# Patient Record
Sex: Male | Born: 1940 | Race: White | Hispanic: No | Marital: Married | Smoking: Former smoker
Health system: Southern US, Community
[De-identification: ages and names within clinical notes are randomized; demographics above are authoritative.]

## PROBLEM LIST (undated history)

## (undated) DIAGNOSIS — I350 Nonrheumatic aortic (valve) stenosis: Secondary | ICD-10-CM

## (undated) DIAGNOSIS — M1990 Unspecified osteoarthritis, unspecified site: Secondary | ICD-10-CM

## (undated) DIAGNOSIS — L409 Psoriasis, unspecified: Secondary | ICD-10-CM

## (undated) DIAGNOSIS — Z9989 Dependence on other enabling machines and devices: Secondary | ICD-10-CM

## (undated) DIAGNOSIS — E119 Type 2 diabetes mellitus without complications: Secondary | ICD-10-CM

## (undated) DIAGNOSIS — G4733 Obstructive sleep apnea (adult) (pediatric): Secondary | ICD-10-CM

## (undated) DIAGNOSIS — L219 Seborrheic dermatitis, unspecified: Secondary | ICD-10-CM

## (undated) DIAGNOSIS — K5732 Diverticulitis of large intestine without perforation or abscess without bleeding: Secondary | ICD-10-CM

## (undated) DIAGNOSIS — I10 Essential (primary) hypertension: Secondary | ICD-10-CM

## (undated) DIAGNOSIS — J309 Allergic rhinitis, unspecified: Secondary | ICD-10-CM

## (undated) DIAGNOSIS — E785 Hyperlipidemia, unspecified: Secondary | ICD-10-CM

## (undated) DIAGNOSIS — Z8669 Personal history of other diseases of the nervous system and sense organs: Secondary | ICD-10-CM

## (undated) HISTORY — DX: Allergic rhinitis, unspecified: J30.9

## (undated) HISTORY — PX: TONSILLECTOMY: SUR1361

## (undated) HISTORY — DX: Personal history of other diseases of the nervous system and sense organs: Z86.69

## (undated) HISTORY — DX: Obstructive sleep apnea (adult) (pediatric): Z99.89

## (undated) HISTORY — PX: KNEE ARTHROSCOPY: SUR90

## (undated) HISTORY — PX: HAND SURGERY: SHX662

## (undated) HISTORY — DX: Nonrheumatic aortic (valve) stenosis: I35.0

## (undated) HISTORY — DX: Seborrheic dermatitis, unspecified: L21.9

## (undated) HISTORY — DX: Diverticulitis of large intestine without perforation or abscess without bleeding: K57.32

## (undated) HISTORY — PX: HERNIA REPAIR: SHX51

## (undated) HISTORY — PX: VASECTOMY: SHX75

## (undated) HISTORY — DX: Obstructive sleep apnea (adult) (pediatric): G47.33

---

## 2000-01-21 ENCOUNTER — Encounter: Admission: RE | Admit: 2000-01-21 | Discharge: 2000-01-21 | Payer: Self-pay | Admitting: *Deleted

## 2000-01-21 ENCOUNTER — Encounter: Payer: Self-pay | Admitting: *Deleted

## 2005-09-10 ENCOUNTER — Ambulatory Visit (HOSPITAL_COMMUNITY): Admission: RE | Admit: 2005-09-10 | Discharge: 2005-09-10 | Payer: Self-pay | Admitting: Gastroenterology

## 2007-02-22 ENCOUNTER — Encounter: Admission: RE | Admit: 2007-02-22 | Discharge: 2007-03-01 | Payer: Self-pay | Admitting: Specialist

## 2011-11-02 DIAGNOSIS — M660 Rupture of popliteal cyst: Secondary | ICD-10-CM

## 2011-11-02 HISTORY — DX: Rupture of popliteal cyst: M66.0

## 2012-07-12 ENCOUNTER — Other Ambulatory Visit: Payer: Self-pay | Admitting: Internal Medicine

## 2012-07-14 ENCOUNTER — Ambulatory Visit
Admission: RE | Admit: 2012-07-14 | Discharge: 2012-07-14 | Disposition: A | Discharge status: Home / Self Care | Payer: Medicare Other | Source: Ambulatory Visit | Attending: Internal Medicine | Admitting: Internal Medicine

## 2012-07-14 ENCOUNTER — Other Ambulatory Visit: Payer: Self-pay | Admitting: Internal Medicine

## 2012-07-14 MED ORDER — GADOBENATE DIMEGLUMINE 529 MG/ML IV SOLN
17.0000 mL | Freq: Once | INTRAVENOUS | Status: AC | PRN
  Administered 2012-07-14: 17 mL via INTRAVENOUS

## 2012-07-25 ENCOUNTER — Other Ambulatory Visit: Payer: Self-pay | Admitting: Orthopedic Surgery

## 2012-07-25 DIAGNOSIS — M7120 Synovial cyst of popliteal space [Baker], unspecified knee: Secondary | ICD-10-CM

## 2012-07-29 ENCOUNTER — Ambulatory Visit
Admission: RE | Admit: 2012-07-29 | Discharge: 2012-07-29 | Disposition: A | Discharge status: Home / Self Care | Payer: Medicare Other | Source: Ambulatory Visit | Attending: Orthopedic Surgery | Admitting: Orthopedic Surgery

## 2012-07-29 DIAGNOSIS — M7120 Synovial cyst of popliteal space [Baker], unspecified knee: Secondary | ICD-10-CM

## 2012-08-25 ENCOUNTER — Encounter (HOSPITAL_BASED_OUTPATIENT_CLINIC_OR_DEPARTMENT_OTHER): Payer: Self-pay | Admitting: *Deleted

## 2012-08-28 ENCOUNTER — Encounter (HOSPITAL_BASED_OUTPATIENT_CLINIC_OR_DEPARTMENT_OTHER): Payer: Self-pay | Admitting: *Deleted

## 2012-08-28 NOTE — Progress Notes (Signed)
NPO AFTER MN. ARRIVES AT 1030. NEEDS ISTAT AND EKG. 

## 2012-08-29 ENCOUNTER — Encounter (HOSPITAL_BASED_OUTPATIENT_CLINIC_OR_DEPARTMENT_OTHER): Payer: Self-pay

## 2012-08-29 ENCOUNTER — Ambulatory Visit (HOSPITAL_BASED_OUTPATIENT_CLINIC_OR_DEPARTMENT_OTHER): Payer: Medicare Other

## 2012-08-29 ENCOUNTER — Encounter (HOSPITAL_BASED_OUTPATIENT_CLINIC_OR_DEPARTMENT_OTHER)
Admission: RE | Disposition: A | Discharge status: Home / Self Care | Payer: Self-pay | Source: Ambulatory Visit | Attending: Orthopedic Surgery

## 2012-08-29 ENCOUNTER — Ambulatory Visit (HOSPITAL_BASED_OUTPATIENT_CLINIC_OR_DEPARTMENT_OTHER)
Admission: RE | Admit: 2012-08-29 | Discharge: 2012-08-29 | Disposition: A | Discharge status: Home / Self Care | Payer: Medicare Other | Source: Ambulatory Visit | Attending: Orthopedic Surgery | Admitting: Orthopedic Surgery

## 2012-08-29 ENCOUNTER — Encounter (HOSPITAL_BASED_OUTPATIENT_CLINIC_OR_DEPARTMENT_OTHER): Payer: Self-pay | Admitting: Anesthesiology

## 2012-08-29 ENCOUNTER — Encounter (HOSPITAL_BASED_OUTPATIENT_CLINIC_OR_DEPARTMENT_OTHER): Payer: Self-pay | Admitting: *Deleted

## 2012-08-29 DIAGNOSIS — E785 Hyperlipidemia, unspecified: Secondary | ICD-10-CM | POA: Insufficient documentation

## 2012-08-29 DIAGNOSIS — IMO0002 Reserved for concepts with insufficient information to code with codable children: Secondary | ICD-10-CM | POA: Insufficient documentation

## 2012-08-29 DIAGNOSIS — M1710 Unilateral primary osteoarthritis, unspecified knee: Secondary | ICD-10-CM | POA: Insufficient documentation

## 2012-08-29 DIAGNOSIS — Z79899 Other long term (current) drug therapy: Secondary | ICD-10-CM | POA: Insufficient documentation

## 2012-08-29 DIAGNOSIS — X58XXXA Exposure to other specified factors, initial encounter: Secondary | ICD-10-CM | POA: Insufficient documentation

## 2012-08-29 DIAGNOSIS — M1711 Unilateral primary osteoarthritis, right knee: Secondary | ICD-10-CM | POA: Diagnosis present

## 2012-08-29 DIAGNOSIS — M23205 Derangement of unspecified medial meniscus due to old tear or injury, unspecified knee: Secondary | ICD-10-CM | POA: Diagnosis present

## 2012-08-29 DIAGNOSIS — E119 Type 2 diabetes mellitus without complications: Secondary | ICD-10-CM | POA: Insufficient documentation

## 2012-08-29 DIAGNOSIS — I10 Essential (primary) hypertension: Secondary | ICD-10-CM | POA: Insufficient documentation

## 2012-08-29 HISTORY — DX: Type 2 diabetes mellitus without complications: E11.9

## 2012-08-29 HISTORY — DX: Unspecified osteoarthritis, unspecified site: M19.90

## 2012-08-29 HISTORY — DX: Essential (primary) hypertension: I10

## 2012-08-29 HISTORY — DX: Hyperlipidemia, unspecified: E78.5

## 2012-08-29 LAB — GLUCOSE, CAPILLARY: Glucose-Capillary: 257 mg/dL — ABNORMAL HIGH (ref 70–99)

## 2012-08-29 LAB — POCT I-STAT 4, (NA,K, GLUC, HGB,HCT)
HCT: 45 % (ref 39.0–52.0)
Sodium: 140 mEq/L (ref 135–145)

## 2012-08-29 SURGERY — ARTHROSCOPY, KNEE, WITH MEDIAL MENISCECTOMY
Anesthesia: General | Site: Knee | Laterality: Right | Wound class: Clean

## 2012-08-29 MED ORDER — PROPOFOL 10 MG/ML IV BOLUS
INTRAVENOUS | Status: DC | PRN
  Administered 2012-08-29: 100 mg via INTRAVENOUS
  Administered 2012-08-29: 200 mg via INTRAVENOUS
  Administered 2012-08-29: 100 mg via INTRAVENOUS

## 2012-08-29 MED ORDER — LIDOCAINE HCL (CARDIAC) 20 MG/ML IV SOLN
INTRAVENOUS | Status: DC | PRN
  Administered 2012-08-29: 100 mg via INTRAVENOUS

## 2012-08-29 MED ORDER — LACTATED RINGERS IV SOLN
INTRAVENOUS | Status: DC | PRN
  Administered 2012-08-29: 11:00:00 via INTRAVENOUS

## 2012-08-29 MED ORDER — LACTATED RINGERS IV SOLN
INTRAVENOUS | Status: DC
  Administered 2012-08-29: 11:00:00 via INTRAVENOUS

## 2012-08-29 MED ORDER — ONDANSETRON HCL 4 MG/2ML IJ SOLN
INTRAMUSCULAR | Status: DC | PRN
  Administered 2012-08-29: 4 mg via INTRAVENOUS

## 2012-08-29 MED ORDER — CEFAZOLIN SODIUM-DEXTROSE 2-3 GM-% IV SOLR: 2.0000 g | INTRAVENOUS | Status: DC

## 2012-08-29 MED ORDER — BUPIVACAINE-EPINEPHRINE 0.25% -1:200000 IJ SOLN
INTRAMUSCULAR | Status: DC | PRN
  Administered 2012-08-29: 30 mL

## 2012-08-29 MED ORDER — LACTATED RINGERS IV SOLN: INTRAVENOUS | Status: DC

## 2012-08-29 MED ORDER — SODIUM CHLORIDE 0.9 % IR SOLN
Status: DC | PRN
  Administered 2012-08-29: 6000 mL

## 2012-08-29 MED ORDER — MEPERIDINE HCL 25 MG/ML IJ SOLN: 6.2500 mg | INTRAMUSCULAR | Status: DC | PRN

## 2012-08-29 MED ORDER — PROMETHAZINE HCL 25 MG/ML IJ SOLN: 6.2500 mg | INTRAMUSCULAR | Status: DC | PRN

## 2012-08-29 MED ORDER — DEXTROSE-NACL 5-0.45 % IV SOLN
INTRAVENOUS | Status: DC | PRN
  Administered 2012-08-29: 13:00:00 via INTRAVENOUS

## 2012-08-29 MED ORDER — OXYCODONE-ACETAMINOPHEN 10-650 MG PO TABS: 1.0000 | ORAL_TABLET | Freq: Four times a day (QID) | ORAL | Status: DC | PRN

## 2012-08-29 MED ORDER — FENTANYL CITRATE 0.05 MG/ML IJ SOLN
INTRAMUSCULAR | Status: DC | PRN
  Administered 2012-08-29 (×3): 50 ug via INTRAVENOUS
  Administered 2012-08-29: 100 ug via INTRAVENOUS
  Administered 2012-08-29: 50 ug via INTRAVENOUS

## 2012-08-29 MED ORDER — FENTANYL CITRATE 0.05 MG/ML IJ SOLN: 25.0000 ug | INTRAMUSCULAR | Status: DC | PRN

## 2012-08-29 MED ORDER — ALBUTEROL SULFATE (5 MG/ML) 0.5% IN NEBU
2.5000 mg | INHALATION_SOLUTION | Freq: Four times a day (QID) | RESPIRATORY_TRACT | Status: DC | PRN
  Administered 2012-08-29: 2.5 mg via RESPIRATORY_TRACT

## 2012-08-29 MED ORDER — MIDAZOLAM HCL 5 MG/5ML IJ SOLN
INTRAMUSCULAR | Status: DC | PRN
  Administered 2012-08-29: 2 mg via INTRAVENOUS

## 2012-08-29 MED ORDER — KETOROLAC TROMETHAMINE 30 MG/ML IJ SOLN
INTRAMUSCULAR | Status: DC | PRN
Start: 2012-08-29 — End: 2012-08-29
  Administered 2012-08-29: 30 mg via INTRAVENOUS

## 2012-08-29 MED ORDER — BACITRACIN-NEOMYCIN-POLYMYXIN 400-5-5000 EX OINT
TOPICAL_OINTMENT | CUTANEOUS | Status: DC | PRN
  Administered 2012-08-29: 1 via TOPICAL

## 2012-08-29 MED ORDER — CHLORHEXIDINE GLUCONATE 4 % EX LIQD: 60.0000 mL | Freq: Once | CUTANEOUS | Status: DC

## 2012-08-29 SURGICAL SUPPLY — 47 items
BANDAGE ELASTIC 4 VELCRO ST LF (GAUZE/BANDAGES/DRESSINGS) ×2 IMPLANT
BANDAGE ELASTIC 6 VELCRO ST LF (GAUZE/BANDAGES/DRESSINGS) ×2 IMPLANT
BLADE CUDA 5.5 (BLADE) IMPLANT
BLADE CUTTER GATOR 3.5 (BLADE) IMPLANT
BLADE CUTTER MENIS 5.5 (BLADE) IMPLANT
BLADE GREAT WHITE 4.2 (BLADE) ×2 IMPLANT
BLADE SURG 15 STRL LF DISP TIS (BLADE) IMPLANT
BLADE SURG 15 STRL SS (BLADE)
BNDG COHESIVE 6X5 TAN NS LF (GAUZE/BANDAGES/DRESSINGS) ×2 IMPLANT
CANISTER SUCT LVC 12 LTR MEDI- (MISCELLANEOUS) ×2 IMPLANT
CANISTER SUCTION 2500CC (MISCELLANEOUS) ×2 IMPLANT
CLOTH BEACON ORANGE TIMEOUT ST (SAFETY) ×2 IMPLANT
DRAPE ARTHROSCOPY W/POUCH 114 (DRAPES) ×2 IMPLANT
DRAPE LG THREE QUARTER DISP (DRAPES) ×2 IMPLANT
DRSG EMULSION OIL 3X3 NADH (GAUZE/BANDAGES/DRESSINGS) ×2 IMPLANT
DRSG PAD ABDOMINAL 8X10 ST (GAUZE/BANDAGES/DRESSINGS) ×4 IMPLANT
DURAPREP 26ML APPLICATOR (WOUND CARE) ×2 IMPLANT
ELECT MENISCUS 165MM 90D (ELECTRODE) IMPLANT
ELECT REM PT RETURN 9FT ADLT (ELECTROSURGICAL)
ELECTRODE REM PT RTRN 9FT ADLT (ELECTROSURGICAL) IMPLANT
GELFOAM SMALL 12 7MM 315 3 (MISCELLANEOUS) IMPLANT
GLOVE BIO SURGEON STRL SZ 6.5 (GLOVE) ×1 IMPLANT
GLOVE ECLIPSE 6.0 STRL STRAW (GLOVE) ×1 IMPLANT
GLOVE ECLIPSE 8.0 STRL XLNG CF (GLOVE) ×2 IMPLANT
GLOVE INDICATOR 8.5 STRL (GLOVE) ×4 IMPLANT
GLOVE SURG ORTHO 6.0 STRL STRW (GLOVE) ×2 IMPLANT
GOWN PREVENTION PLUS XLARGE (GOWN DISPOSABLE) ×1 IMPLANT
GOWN STRL NON-REIN LRG LVL3 (GOWN DISPOSABLE) ×1 IMPLANT
GOWN W/COTTON TOWEL STD LRG (GOWNS) ×1 IMPLANT
GOWN XL W/COTTON TOWEL STD (GOWNS) ×1 IMPLANT
IV NS IRRIG 3000ML ARTHROMATIC (IV SOLUTION) ×3 IMPLANT
KNEE WRAP E Z 3 GEL PACK (MISCELLANEOUS) ×2 IMPLANT
MINI VAC (SURGICAL WAND) ×2 IMPLANT
PACK ARTHROSCOPY DSU (CUSTOM PROCEDURE TRAY) ×2 IMPLANT
PACK BASIN DAY SURGERY FS (CUSTOM PROCEDURE TRAY) ×2 IMPLANT
PAD CAST 4YDX4 CTTN HI CHSV (CAST SUPPLIES) IMPLANT
PADDING CAST COTTON 4X4 STRL (CAST SUPPLIES) ×2
PADDING CAST COTTON 6X4 STRL (CAST SUPPLIES) ×2 IMPLANT
PENCIL BUTTON HOLSTER BLD 10FT (ELECTRODE) IMPLANT
SET ARTHROSCOPY TUBING (MISCELLANEOUS) ×2
SET ARTHROSCOPY TUBING LN (MISCELLANEOUS) ×1 IMPLANT
SPONGE GAUZE 4X4 12PLY (GAUZE/BANDAGES/DRESSINGS) ×2 IMPLANT
SUT ETHILON 3 0 PS 1 (SUTURE) ×2 IMPLANT
SYR CONTROL 10ML LL (SYRINGE) ×1 IMPLANT
TOWEL OR 17X24 6PK STRL BLUE (TOWEL DISPOSABLE) ×2 IMPLANT
WAND 90 DEG TURBOVAC W/CORD (SURGICAL WAND) ×2 IMPLANT
WATER STERILE IRR 500ML POUR (IV SOLUTION) ×2 IMPLANT

## 2012-08-29 NOTE — Brief Op Note (Signed)
08/29/2012  1:17 PM  PATIENT:  [name]  71 y.o. male  PRE-OPERATIVE DIAGNOSIS:  Right Medial mensical tear and Osteoarthritis  POST-OPERATIVE DIAGNOSIS: Same as Pre-Op  PROCEDURE:  Procedure(s) (LRB) with comments: KNEE ARTHROSCOPY WITH MEDIAL MENISECTOMY (Right) - abrasion chondopalsty, micro fracture technique chondoplasty, synovectomy supra patellar Pouch  SURGEON:  Surgeon(s) and Role:    * Jacki Cones, MD - Primary     ASSISTANTS: OR Tech   ANESTHESIA:   general  EBL:     BLOOD ADMINISTERED:none  DRAINS: none   LOCAL MEDICATIONS USED:  BUPIVICAINE 0.25%,30cc  SPECIMEN:  No Specimen  DISPOSITION OF SPECIMEN:  N/A  COUNTS:  YES  TOURNIQUET:  * No tourniquets in log *  DICTATION: .Other Dictation: Dictation Number [id number]  PLAN OF CARE: Discharge to home after PACU  PATIENT DISPOSITION:  PACU - hemodynamically stable.   Delay start of Pharmacological VTE agent (>24hrs) due to surgical blood loss or risk of bleeding: yes

## 2012-08-29 NOTE — Interval H&P Note (Signed)
History and Physical Interval Note:  08/29/2012 12:32 PM  [name]  has presented today for surgery, with the diagnosis of right mensical tear   The various methods of treatment have been discussed with the patient and family. After consideration of risks, benefits and other options for treatment, the patient has consented to  Procedure(s) (LRB) with comments: KNEE ARTHROSCOPY WITH MEDIAL MENISECTOMY (Right) as a surgical intervention .  The patient's history has been reviewed, patient examined, no change in status, stable for surgery.  I have reviewed the patient's chart and labs.  Questions were answered to the patient's satisfaction.     [name] A

## 2012-08-29 NOTE — H&P (Signed)
[name] is an 71 y.o. male.   Chief Complaint: Pain in Right Knee HPI: Torn Medial Meniscus Right Knee  Past Medical History  Diagnosis Date  . Acute meniscal tear of knee RIGHT KNEE  . Hypertension   . Hyperlipidemia   . Diabetes mellitus, type 2   . Arthritis THUMB  . Nichol's cyst of knee RIGHT    Past Surgical History  Procedure Date  . Knee arthroscopy 1988 (APPROX)    RIGHT KNEE    History reviewed. No pertinent family history. Social History:  reports that he has never smoked. He has never used smokeless tobacco. He reports that he does not drink alcohol or use illicit drugs.  Allergies: No Known Allergies  Medications Prior to Admission  Medication Sig Dispense Refill  . acetaminophen (TYLENOL) 325 MG tablet Take 650 mg by mouth every 6 (six) hours as needed.      . Cholecalciferol (VITAMIN D-3) 1000 UNITS CAPS Take 1 capsule by mouth daily.      Marland Kitchen glimepiride (AMARYL) 1 MG tablet Take 1 mg by mouth daily before breakfast.      . losartan (COZAAR) 100 MG tablet Take 100 mg by mouth daily.      . metFORMIN (GLUCOPHAGE) 1000 MG tablet Take 1,000 mg by mouth 2 (two) times daily with [name] meal.      . simvastatin (ZOCOR) 20 MG tablet Take 20 mg by mouth every evening.        Results for orders placed during the hospital encounter of 08/29/12 (from the past 48 hour(s))  POCT I-STAT 4, (NA,K, GLUC, HGB,HCT)     Status: Abnormal   Collection Time   08/29/12 11:23 AM      Component Value Range Comment   Sodium 140  135 - 145 mEq/L    Potassium 4.0  3.5 - 5.1 mEq/L    Glucose, Bld 121 (*) 70 - 99 mg/dL    HCT 78.2  95.6 - 21.3 %    Hemoglobin 15.3  13.0 - 17.0 g/dL    No results found.  Review of Systems  Constitutional: Negative.   HENT: Negative.   Eyes: Negative.   Respiratory: Negative.   Cardiovascular: Negative.   Gastrointestinal: Negative.   Genitourinary: Negative.   Musculoskeletal: Positive for joint pain.  Skin: Negative.   Neurological:  Negative.   Endo/Heme/Allergies: Negative.   Psychiatric/Behavioral: Negative.     Blood pressure 141/89, pulse 71, temperature 97 F (36.1 C), temperature source Oral, resp. rate 20, height 5\' 5"  (1.651 m), weight 84.057 kg (185 lb 5 oz), SpO2 96.00%. Physical Exam  Constitutional: He appears well-developed.  HENT:  Head: Normocephalic.  Eyes: Pupils are equal, round, and reactive to light.  Neck: Normal range of motion.  Cardiovascular: Normal rate and regular rhythm.   Respiratory: Breath sounds normal.  GI: Soft.  Musculoskeletal:       Right knee: He exhibits swelling, effusion and abnormal meniscus. tenderness found. Medial joint line tenderness noted.       Pain and swelling of Right Knee  Neurological: He is alert.  Skin: Skin is warm.     Assessment/Plan Right Knee Arthroscopic Medial Meniscectomy  [name] 08/29/2012, 12:27 PM

## 2012-08-29 NOTE — Transfer of Care (Signed)
Immediate Anesthesia Transfer of Care Note  Patient: [name]  Procedure(s) Performed: Procedure(s) (LRB): KNEE ARTHROSCOPY WITH MEDIAL MENISECTOMY (Right)  Patient Location: PACU  Anesthesia Type: General  Level of Consciousness: awake, sedated, patient stable moving head and responds to stimulation  Airway & Oxygen Therapy: Patient Spontanous Breathing and Patient connected to face mask oxygen Oral airway placed after LMA removed, suctioned around OA , FM O2 100 % at 10 Liter flow Waking up opening eyes responds to questioning taking deep breaths, BBS equal clear,  SaO 2 95-98  Post-op Assessment: Report given to PACU RN, Post -op Vital signs reviewed and stable and Patient moving all extremities  Post vital signs: Reviewed and stable  Complications: No apparent anesthesia complications  Blood sugar 257 in PACU BP 180 / 99 HR 93 Sao2 95-98

## 2012-08-29 NOTE — Anesthesia Postprocedure Evaluation (Signed)
  Anesthesia Post-op Note  Patient: [name]  Procedure(s) Performed: Procedure(s) (LRB): KNEE ARTHROSCOPY WITH MEDIAL MENISECTOMY (Right)  Patient Location: PACU  Anesthesia Type: General  Level of Consciousness: awake and alert   Airway and Oxygen Therapy: Patient Spontanous Breathing  Post-op Pain: mild  Post-op Assessment: Post-op Vital signs reviewed, Patient's Cardiovascular Status Stable, Respiratory Function Stable, Patent Airway and No signs of Nausea or vomiting  Post-op Vital Signs: stable  Complications: No apparent anesthesia complications

## 2012-08-29 NOTE — Anesthesia Procedure Notes (Signed)
Procedure Name: LMA Insertion Date/Time: 08/29/2012 12:38 PM Performed by: Jessica Priest Pre-anesthesia Checklist: Patient identified, Emergency Drugs available, Suction available and Patient being monitored Patient Re-evaluated:Patient Re-evaluated prior to inductionOxygen Delivery Method: Circle System Utilized Preoxygenation: Pre-oxygenation with 100% oxygen Intubation Type: IV induction Ventilation: Mask ventilation without difficulty LMA: LMA with gastric port inserted LMA Size: 5.0 Number of attempts: 1 Placement Confirmation: positive ETCO2 Tube secured with: Tape Dental Injury: Teeth and Oropharynx as per pre-operative assessment

## 2012-08-29 NOTE — Anesthesia Preprocedure Evaluation (Addendum)
Anesthesia Evaluation  Patient identified by MRN, date of birth, ID band Patient awake    Reviewed: Allergy & Precautions, H&P , NPO status , Patient's Chart, lab work & pertinent test results  Airway Mallampati: II TM Distance: >3 FB Neck ROM: Full    Dental No notable dental hx.    Pulmonary neg pulmonary ROS,  breath sounds clear to auscultation  Pulmonary exam normal       Cardiovascular hypertension, Pt. on medications negative cardio ROS  Rhythm:Regular Rate:Normal     Neuro/Psych negative neurological ROS  negative psych ROS   GI/Hepatic negative GI ROS, Neg liver ROS,   Endo/Other  negative endocrine ROSdiabetes, Type 2, Oral Hypoglycemic Agents  Renal/GU negative Renal ROS  negative genitourinary   Musculoskeletal negative musculoskeletal ROS (+)   Abdominal   Peds negative pediatric ROS (+)  Hematology negative hematology ROS (+)   Anesthesia Other Findings   Reproductive/Obstetrics negative OB ROS                          Anesthesia Physical Anesthesia Plan  ASA: II  Anesthesia Plan: General   Post-op Pain Management:    Induction: Intravenous  Airway Management Planned: LMA  Additional Equipment:   Intra-op Plan:   Post-operative Plan: Extubation in OR  Informed Consent: I have reviewed the patients History and Physical, chart, labs and discussed the procedure including the risks, benefits and alternatives for the proposed anesthesia with the patient or authorized representative who has indicated his/her understanding and acceptance.   Dental advisory given  Plan Discussed with: CRNA  Anesthesia Plan Comments:         Anesthesia Quick Evaluation

## 2012-08-30 NOTE — Op Note (Signed)
NAMECALTON, ARCIDIACONO               ACCOUNT NO.:  [id number]  MEDICAL RECORD NO.:  [id number]  LOCATION:                                 FACILITY:  PHYSICIAN:  Georges Lynch. Kushal Saunders, M.D.DATE OF BIRTH:  25-Mar-1941  DATE OF PROCEDURE:  08/29/2012 DATE OF DISCHARGE:                              OPERATIVE REPORT   SURGEON:  Georges Lynch. Darrelyn Hillock, M.D.  ASSISTANT:  Nurse.  PREOPERATIVE DIAGNOSES: 1. Degenerative arthritis, right knee. 2. Bucket-handle tear, medial meniscus, right knee.  POSTOPERATIVE DIAGNOSES: 1. Degenerative arthritis, right knee. 2. Bucket-handle tear, medial meniscus, right knee.  OPERATION: 1. Diagnostic arthroscopy, right knee. 2. Abrasion chondroplasty, medial femoral condyle, right knee. 3. Medial meniscectomy of bucket-handle tear, right knee. 4. Microfracture technique medial femoral condyle, right knee. 5. Synovectomy of suprapatellar pouch, right knee.  PROCEDURE:  Under general anesthesia, routine orthopedic prepping and draping on the right lower extremity was carried out.  He had 2 g of IV Ancef.  The appropriate time-out was carried out prior to making any incisions.  I also marked the appropriate right leg in the holding area. At this time, a small punctate incision was made in the suprapatellar pouch.  Inflow cannula was inserted, knee was distended with saline. Another small punctate incision was made in the anterolateral joint. The arthroscope was entered from lateral approach and the complete diagnostic arthroscopy was carried out.  I went up in the suprapatellar pouch.  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  He has chronic synovitis.  I introduced the ArthroCare and did a synovectomy in the usual fashion.  I went down to the lateral joint, the lateral joint showed some mild arthritic changes.  The lateral meniscus was intact.  Cruciates were intact.  I went over the medial joint where the main problem was he had a severe  bucket-handle tear of the posterior horn of the medial meniscus, which extended up to the midportion.  I did a partial medial meniscectomy at this time.  Following that, I did an abrasion chondroplasty of the distal medial femoral condyle.  He had severe highlight that chondromalacia.  I then utilized a microfracture technique in the usual fashion to the medial femoral condyle.  I thoroughly irrigated out the area, removed all the fluid, closed all three punctate incisions with 3-0 nylon suture.  I injected 30 mL of 0.25% Marcaine and epinephrine into the knee joint.  Sterile Neosporin dressing was applied.  The patient left the operative room in satisfactory condition.          ______________________________ Georges Lynch Darrelyn Hillock, M.D.     RAG/MEDQ  D:  08/29/2012  T:  08/30/2012  Job:  [id number]

## 2013-07-24 ENCOUNTER — Encounter (HOSPITAL_COMMUNITY): Payer: Self-pay | Admitting: Pharmacy Technician

## 2013-07-24 NOTE — H&P (Signed)
TOTAL KNEE ADMISSION H&P  Patient is being admitted for right total knee arthroplasty.  Subjective:  Chief Complaint:right knee pain.  HPI: [name], 72 y.o. male, has a history of pain and functional disability in the right knee due to arthritis and has failed non-surgical conservative treatments for greater than 12 weeks to includeNSAID's and/or analgesics, corticosteriod injections, viscosupplementation injections, use of assistive devices and activity modification.  Onset of symptoms was gradual, starting 1 year ago with gradually worsening course since that time. The patient noted prior procedures on the knee to include  arthroscopy and menisectomy on the right knee(s).  Patient currently rates pain in the right knee(s) at 6 out of 10 with activity. Patient has night pain, worsening of pain with activity and weight bearing, pain that interferes with activities of daily living, pain with passive range of motion, crepitus and joint swelling.  Patient has evidence of periarticular osteophytes and joint space narrowing by imaging studies.  There is no active infection.  Patient Active Problem List   Diagnosis Date Noted  . Primary osteoarthritis of right knee 08/29/2012  . Meniscus, medial, bucket handle tear, old 08/29/2012   Past Medical History  Diagnosis Date  . Acute meniscal tear of knee RIGHT KNEE  . Hypertension   . Hyperlipidemia   . Diabetes mellitus, type 2   . Arthritis THUMB  . Rinehart's cyst of knee RIGHT    Past Surgical History  Procedure Laterality Date  . Knee arthroscopy  1988 (APPROX)    RIGHT KNEE     Current outpatient prescriptions: aspirin 325 MG tablet, Take 325 mg by mouth every 6 (six) hours as needed for pain., Disp: , Rfl: ;   Cholecalciferol (VITAMIN D-3) 1000 UNITS CAPS, Take 1 capsule by mouth daily., Disp: , Rfl: ;   glimepiride (AMARYL) 1 MG tablet, Take 1 mg by mouth daily before breakfast., Disp: , Rfl: ;   losartan (COZAAR) 100 MG tablet,  Take 100 mg by mouth every morning. , Disp: , Rfl:  metFORMIN (GLUCOPHAGE) 1000 MG tablet, Take 1,000 mg by mouth 2 (two) times daily with a meal., Disp: , Rfl: ;   simvastatin (ZOCOR) 20 MG tablet, Take 20 mg by mouth every evening., Disp: , Rfl: ;   vitamin C (ASCORBIC ACID) 500 MG tablet, Take 500 mg by mouth daily., Disp: , Rfl: ;   vitamin E (VITAMIN E) 400 UNIT capsule, Take 400 Units by mouth daily., Disp: , Rfl:   No Known Allergies  History  Substance Use Topics  . Smoking status: Never Smoker   . Smokeless tobacco: Never Used  . Alcohol Use: No    Family history Father deceased age 64 due to "old age" Mother living age 73; dementia Diabetes mellitus type II  Review of Systems  Constitutional: Negative.   HENT: Negative.  Negative for neck pain.   Eyes: Negative.   Respiratory: Negative.   Cardiovascular: Negative.   Gastrointestinal: Negative.   Genitourinary: Negative.   Musculoskeletal: Positive for joint pain. Negative for myalgias, back pain and falls.  Skin: Negative.   Neurological: Negative.   Endo/Heme/Allergies: Negative.   Psychiatric/Behavioral: Negative.     Objective:  Physical Exam  Constitutional: He is oriented to person, place, and time. He appears well-developed and well-nourished. No distress.  HENT:  Head: Normocephalic and atraumatic.  Right Ear: External ear normal.  Left Ear: External ear normal.  Mouth/Throat: Oropharynx is clear and moist.  Eyes: Conjunctivae and EOM are normal.  Neck: Normal  range of motion. Neck supple.  Cardiovascular: Normal rate, regular rhythm, normal heart sounds and intact distal pulses.   No murmur heard. Respiratory: Effort normal and breath sounds normal. No respiratory distress. He has no wheezes.  GI: Soft. Bowel sounds are normal. He exhibits no distension. There is no tenderness.  Musculoskeletal:       Right hip: Normal.       Left hip: Normal.       Right knee: He exhibits decreased range of motion  and swelling. He exhibits no effusion and no erythema. Tenderness found. Medial joint line and lateral joint line tenderness noted.       Left knee: Normal.       Right lower leg: He exhibits no tenderness and no swelling.       Left lower leg: He exhibits no tenderness and no swelling.  Neurological: He is alert and oriented to person, place, and time. He has normal strength and normal reflexes. No sensory deficit.  Skin: No rash noted. He is not diaphoretic. No erythema.  Psychiatric: He has a normal mood and affect. His behavior is normal.   Vitals Weight: 174 lb Height: 65 in Body Surface Area: 1.9 m Body Mass Index: 28.95 kg/m BP: 130/70 (Sitting, Left Arm, Standard)  HR: 72  Imaging Review Plain radiographs demonstrate moderate degenerative joint disease of the right knee(s). The overall alignment ismild varus. The bone quality appears to be good for age and reported activity level.  Assessment/Plan:  End stage arthritis, right knee   The patient history, physical examination, clinical judgment of the provider and imaging studies are consistent with end stage degenerative joint disease of the right knee(s) and total knee arthroplasty is deemed medically necessary. The treatment options including medical management, injection therapy arthroscopy and arthroplasty were discussed at length. The risks and benefits of total knee arthroplasty were presented and reviewed. The risks due to aseptic loosening, infection, stiffness, patella tracking problems, thromboembolic complications and other imponderables were discussed. The patient acknowledged the explanation, agreed to proceed with the plan and consent was signed. Patient is being admitted for inpatient treatment for surgery, pain control, PT, OT, prophylactic antibiotics, VTE prophylaxis, progressive ambulation and ADL's and discharge planning. The patient is planning to be discharged home with home health services   [place], [place]

## 2013-07-26 ENCOUNTER — Encounter (HOSPITAL_COMMUNITY)
Admission: RE | Admit: 2013-07-26 | Discharge: 2013-07-26 | Disposition: A | Discharge status: Home / Self Care | Payer: Medicare Other | Source: Ambulatory Visit | Attending: Orthopedic Surgery | Admitting: Orthopedic Surgery

## 2013-07-26 ENCOUNTER — Encounter (HOSPITAL_COMMUNITY): Payer: Self-pay

## 2013-07-26 ENCOUNTER — Ambulatory Visit (HOSPITAL_COMMUNITY)
Admission: RE | Admit: 2013-07-26 | Discharge: 2013-07-26 | Disposition: A | Discharge status: Home / Self Care | Payer: Medicare Other | Source: Ambulatory Visit | Attending: Orthopedic Surgery | Admitting: Orthopedic Surgery

## 2013-07-26 DIAGNOSIS — Z01810 Encounter for preprocedural cardiovascular examination: Secondary | ICD-10-CM | POA: Insufficient documentation

## 2013-07-26 DIAGNOSIS — Z01812 Encounter for preprocedural laboratory examination: Secondary | ICD-10-CM | POA: Insufficient documentation

## 2013-07-26 DIAGNOSIS — M1710 Unilateral primary osteoarthritis, unspecified knee: Secondary | ICD-10-CM | POA: Insufficient documentation

## 2013-07-26 DIAGNOSIS — Z01818 Encounter for other preprocedural examination: Secondary | ICD-10-CM | POA: Insufficient documentation

## 2013-07-26 LAB — URINALYSIS, ROUTINE W REFLEX MICROSCOPIC
Bilirubin Urine: NEGATIVE
Glucose, UA: NEGATIVE mg/dL
Hgb urine dipstick: NEGATIVE
Ketones, ur: NEGATIVE mg/dL
Leukocytes, UA: NEGATIVE
Nitrite: NEGATIVE
Protein, ur: NEGATIVE mg/dL
Specific Gravity, Urine: 1.02 (ref 1.005–1.030)
Urobilinogen, UA: 1 mg/dL (ref 0.0–1.0)
pH: 6 (ref 5.0–8.0)

## 2013-07-26 LAB — COMPREHENSIVE METABOLIC PANEL
ALT: 17 U/L (ref 0–53)
AST: 13 U/L (ref 0–37)
Albumin: 3.8 g/dL (ref 3.5–5.2)
Alkaline Phosphatase: 40 U/L (ref 39–117)
BUN: 19 mg/dL (ref 6–23)
CO2: 27 mEq/L (ref 19–32)
Calcium: 9.2 mg/dL (ref 8.4–10.5)
Chloride: 102 mEq/L (ref 96–112)
Creatinine, Ser: 0.82 mg/dL (ref 0.50–1.35)
GFR calc Af Amer: >90 mL/min (ref >90)
GFR calc non Af Amer: 88 mL/min — ABNORMAL LOW (ref >90)
Glucose, Bld: 104 mg/dL — ABNORMAL HIGH (ref 70–99)
Potassium: 4.1 mEq/L (ref 3.5–5.1)
Sodium: 139 mEq/L (ref 135–145)
Total Bilirubin: 0.4 mg/dL (ref 0.3–1.2)
Total Protein: 6.7 g/dL (ref 6.0–8.3)

## 2013-07-26 LAB — CBC
HCT: 41.2 % (ref 39.0–52.0)
Hemoglobin: 14 g/dL (ref 13.0–17.0)
MCHC: 34 g/dL (ref 30.0–36.0)
RBC: 4.58 MIL/uL (ref 4.22–5.81)

## 2013-07-26 LAB — APTT: aPTT: 27 seconds (ref 24–37)

## 2013-07-26 LAB — PROTIME-INR
INR: 0.93 (ref 0.00–1.49)
Prothrombin Time: 12.3 seconds (ref 11.6–15.2)

## 2013-07-26 NOTE — Patient Instructions (Addendum)
22 [name]  07/26/2013   Your procedure is scheduled on: 10-1  -2014  Report to [facility] at     0630   AM.  Call this number if you have problems the morning of surgery: [phone]  Or Presurgical Testing [phone](Mazi Schuff)   Do not eat food:After Midnight.    Take these medicines the morning of surgery with A SIP OF WATER: none.   Do not wear jewelry, make-up or nail polish.  Do not wear lotions, powders, or perfumes. You may wear deodorant.  Do not shave 12 hours prior to first CHG shower(legs and under arms).(face and neck okay.)  Do not bring valuables to the hospital.  Contacts, dentures or bridgework,body piercing,  may not be worn into surgery.  Leave suitcase in the car. After surgery it may be brought to your room.  For patients admitted to the hospital, checkout time is 11:00 AM the day of discharge.   Patients discharged the day of surgery will not be allowed to drive home. Must have responsible person with you x 24 hours once discharged.  Name and phone number of your driver: Timtohy Broski [phone] cell/ h [phone]   Special Instructions: CHG(Chlorhedine 4%-"Hibiclens","Betasept","Aplicare") Shower Use Special Wash: see special instructions.(avoid face and genitals)   Please read over the following fact sheets that you were given: MRSA Information, Blood Transfusion fact sheet, Incentive Spirometry Instruction.    Failure to follow these instructions may result in Cancellation of your surgery.   Patient signature_______________________________________________________

## 2013-07-26 NOTE — Pre-Procedure Instructions (Signed)
07-26-13 EKG/CXR done today

## 2013-08-01 ENCOUNTER — Encounter (HOSPITAL_COMMUNITY)
Admission: RE | Disposition: A | Discharge status: Home Health | Payer: Self-pay | Source: Ambulatory Visit | Attending: Orthopedic Surgery

## 2013-08-01 ENCOUNTER — Encounter (HOSPITAL_COMMUNITY): Payer: Self-pay | Admitting: *Deleted

## 2013-08-01 ENCOUNTER — Inpatient Hospital Stay (HOSPITAL_COMMUNITY): Payer: Medicare Other | Admitting: Anesthesiology

## 2013-08-01 ENCOUNTER — Inpatient Hospital Stay (HOSPITAL_COMMUNITY): Payer: Medicare Other

## 2013-08-01 ENCOUNTER — Inpatient Hospital Stay (HOSPITAL_COMMUNITY)
Admission: RE | Admit: 2013-08-01 | Discharge: 2013-08-03 | DRG: 470 | Disposition: A | Discharge status: Home Health | Payer: Medicare Other | Source: Ambulatory Visit | Attending: Orthopedic Surgery | Admitting: Orthopedic Surgery

## 2013-08-01 ENCOUNTER — Encounter (HOSPITAL_COMMUNITY): Payer: Self-pay | Admitting: Anesthesiology

## 2013-08-01 DIAGNOSIS — M1711 Unilateral primary osteoarthritis, right knee: Secondary | ICD-10-CM | POA: Diagnosis present

## 2013-08-01 DIAGNOSIS — E785 Hyperlipidemia, unspecified: Secondary | ICD-10-CM | POA: Diagnosis present

## 2013-08-01 DIAGNOSIS — I10 Essential (primary) hypertension: Secondary | ICD-10-CM | POA: Diagnosis present

## 2013-08-01 DIAGNOSIS — D62 Acute posthemorrhagic anemia: Secondary | ICD-10-CM | POA: Diagnosis not present

## 2013-08-01 DIAGNOSIS — E119 Type 2 diabetes mellitus without complications: Secondary | ICD-10-CM | POA: Diagnosis present

## 2013-08-01 DIAGNOSIS — M1710 Unilateral primary osteoarthritis, unspecified knee: Principal | ICD-10-CM | POA: Diagnosis present

## 2013-08-01 DIAGNOSIS — M24569 Contracture, unspecified knee: Secondary | ICD-10-CM | POA: Diagnosis present

## 2013-08-01 HISTORY — PX: TOTAL KNEE ARTHROPLASTY: SHX125

## 2013-08-01 LAB — GLUCOSE, CAPILLARY
Glucose-Capillary: 86 mg/dL (ref 70–99)
Glucose-Capillary: 130 mg/dL — ABNORMAL HIGH (ref 70–99)
Glucose-Capillary: 179 mg/dL — ABNORMAL HIGH (ref 70–99)
Glucose-Capillary: 153 mg/dL — ABNORMAL HIGH (ref 70–99)

## 2013-08-01 LAB — TYPE AND SCREEN
ABO/RH(D): A NEG
Antibody Screen: NEGATIVE

## 2013-08-01 SURGERY — ARTHROPLASTY, KNEE, TOTAL
Anesthesia: General | Site: Knee | Laterality: Right | Wound class: Clean

## 2013-08-01 MED ORDER — CHLORHEXIDINE GLUCONATE 4 % EX LIQD: 60.0000 mL | Freq: Once | CUTANEOUS | Status: DC

## 2013-08-01 MED ORDER — ACETAMINOPHEN 650 MG RE SUPP: 650.0000 mg | Freq: Four times a day (QID) | RECTAL | Status: DC | PRN

## 2013-08-01 MED ORDER — SODIUM CHLORIDE 0.9 % IR SOLN
Status: DC | PRN
  Administered 2013-08-01: 09:00:00

## 2013-08-01 MED ORDER — CEFAZOLIN SODIUM-DEXTROSE 2-3 GM-% IV SOLR
2.0000 g | INTRAVENOUS | Status: AC
  Administered 2013-08-01: 2 g via INTRAVENOUS

## 2013-08-01 MED ORDER — CELECOXIB 200 MG PO CAPS
200.0000 mg | ORAL_CAPSULE | Freq: Two times a day (BID) | ORAL | Status: DC
Start: 2013-08-01 — End: 2013-08-03
  Administered 2013-08-01 – 2013-08-03 (×5): 200 mg via ORAL
  Filled 2013-08-01 (×7): qty 1

## 2013-08-01 MED ORDER — ALUM & MAG HYDROXIDE-SIMETH 200-200-20 MG/5ML PO SUSP: 30.0000 mL | ORAL | Status: DC | PRN

## 2013-08-01 MED ORDER — BUPIVACAINE LIPOSOME 1.3 % IJ SUSP
INTRAMUSCULAR | Status: DC | PRN
  Administered 2013-08-01: 20 mL

## 2013-08-01 MED ORDER — PHENOL 1.4 % MT LIQD: 1.0000 | OROMUCOSAL | Status: DC | PRN

## 2013-08-01 MED ORDER — METHOCARBAMOL 500 MG PO TABS
500.0000 mg | ORAL_TABLET | Freq: Four times a day (QID) | ORAL | Status: DC | PRN
  Administered 2013-08-02 – 2013-08-03 (×3): 500 mg via ORAL
  Filled 2013-08-01 (×3): qty 1

## 2013-08-01 MED ORDER — EPHEDRINE SULFATE 50 MG/ML IJ SOLN
INTRAMUSCULAR | Status: DC | PRN
  Administered 2013-08-01: 5 mg via INTRAVENOUS

## 2013-08-01 MED ORDER — SODIUM CHLORIDE 0.9 % IR SOLN
Status: DC | PRN
  Administered 2013-08-01: 3000 mL

## 2013-08-01 MED ORDER — BUPIVACAINE-EPINEPHRINE 0.5% -1:200000 IJ SOLN
INTRAMUSCULAR | Status: AC
  Filled 2013-08-01: qty 1

## 2013-08-01 MED ORDER — ONDANSETRON HCL 4 MG/2ML IJ SOLN: 4.0000 mg | Freq: Four times a day (QID) | INTRAMUSCULAR | Status: DC | PRN

## 2013-08-01 MED ORDER — BISACODYL 5 MG PO TBEC: 5.0000 mg | DELAYED_RELEASE_TABLET | Freq: Every day | ORAL | Status: DC | PRN

## 2013-08-01 MED ORDER — HYDROMORPHONE HCL PF 1 MG/ML IJ SOLN
INTRAMUSCULAR | Status: DC | PRN
  Administered 2013-08-01: .2 mg via INTRAVENOUS
  Administered 2013-08-01: .4 mg via INTRAVENOUS

## 2013-08-01 MED ORDER — GLIMEPIRIDE 1 MG PO TABS
1.0000 mg | ORAL_TABLET | Freq: Every day | ORAL | Status: DC
  Administered 2013-08-03: 1 mg via ORAL
  Filled 2013-08-01 (×3): qty 1

## 2013-08-01 MED ORDER — MEPERIDINE HCL 50 MG/ML IJ SOLN: 6.2500 mg | INTRAMUSCULAR | Status: DC | PRN

## 2013-08-01 MED ORDER — HYDROMORPHONE HCL PF 1 MG/ML IJ SOLN
1.0000 mg | INTRAMUSCULAR | Status: DC | PRN
  Filled 2013-08-01: qty 1

## 2013-08-01 MED ORDER — LOSARTAN POTASSIUM 50 MG PO TABS
100.0000 mg | ORAL_TABLET | Freq: Every morning | ORAL | Status: DC
  Administered 2013-08-02: 100 mg via ORAL
  Filled 2013-08-01 (×2): qty 2

## 2013-08-01 MED ORDER — BUPIVACAINE-EPINEPHRINE PF 0.5-1:200000 % IJ SOLN
INTRAMUSCULAR | Status: DC | PRN
  Administered 2013-08-01: 20 mL

## 2013-08-01 MED ORDER — POLYETHYLENE GLYCOL 3350 17 G PO PACK
17.0000 g | PACK | Freq: Every day | ORAL | Status: DC | PRN
  Administered 2013-08-01: 20:00:00 17 g via ORAL

## 2013-08-01 MED ORDER — CEFAZOLIN SODIUM 1-5 GM-% IV SOLN
1.0000 g | Freq: Four times a day (QID) | INTRAVENOUS | Status: AC
  Administered 2013-08-01 (×2): 1 g via INTRAVENOUS
  Filled 2013-08-01 (×2): qty 50

## 2013-08-01 MED ORDER — MIDAZOLAM HCL 5 MG/5ML IJ SOLN
INTRAMUSCULAR | Status: DC | PRN
  Administered 2013-08-01: 2 mg via INTRAVENOUS

## 2013-08-01 MED ORDER — HYDROCODONE-ACETAMINOPHEN 5-325 MG PO TABS
1.0000 | ORAL_TABLET | ORAL | Status: DC | PRN
  Administered 2013-08-01: 23:00:00 1 via ORAL
  Administered 2013-08-02 – 2013-08-03 (×7): 2 via ORAL
  Filled 2013-08-01: qty 2
  Filled 2013-08-01: qty 1
  Filled 2013-08-01 (×6): qty 2

## 2013-08-01 MED ORDER — BUPIVACAINE LIPOSOME 1.3 % IJ SUSP
20.0000 mL | Freq: Once | INTRAMUSCULAR | Status: DC
  Filled 2013-08-01: qty 20

## 2013-08-01 MED ORDER — CISATRACURIUM BESYLATE (PF) 10 MG/5ML IV SOLN
INTRAVENOUS | Status: DC | PRN
  Administered 2013-08-01: 10 mg via INTRAVENOUS

## 2013-08-01 MED ORDER — THROMBIN 5000 UNITS EX SOLR
CUTANEOUS | Status: AC
  Filled 2013-08-01: qty 5000

## 2013-08-01 MED ORDER — FLEET ENEMA 7-19 GM/118ML RE ENEM: 1.0000 | ENEMA | Freq: Once | RECTAL | Status: AC | PRN

## 2013-08-01 MED ORDER — LACTATED RINGERS IV SOLN
INTRAVENOUS | Status: DC
  Administered 2013-08-01 (×2): via INTRAVENOUS
  Administered 2013-08-02: 1000 mL via INTRAVENOUS

## 2013-08-01 MED ORDER — ACETAMINOPHEN 325 MG PO TABS: 650.0000 mg | ORAL_TABLET | Freq: Four times a day (QID) | ORAL | Status: DC | PRN

## 2013-08-01 MED ORDER — PROMETHAZINE HCL 25 MG/ML IJ SOLN: 6.2500 mg | INTRAMUSCULAR | Status: DC | PRN

## 2013-08-01 MED ORDER — FENTANYL CITRATE 0.05 MG/ML IJ SOLN
25.0000 ug | INTRAMUSCULAR | Status: DC | PRN
  Administered 2013-08-01 (×2): 50 ug via INTRAVENOUS

## 2013-08-01 MED ORDER — THROMBIN 5000 UNITS EX SOLR
CUTANEOUS | Status: DC | PRN
  Administered 2013-08-01: 5000 [IU] via TOPICAL

## 2013-08-01 MED ORDER — SUCCINYLCHOLINE CHLORIDE 20 MG/ML IJ SOLN
INTRAMUSCULAR | Status: DC | PRN
  Administered 2013-08-01: 80 mg via INTRAVENOUS

## 2013-08-01 MED ORDER — MENTHOL 3 MG MT LOZG: 1.0000 | LOZENGE | OROMUCOSAL | Status: DC | PRN

## 2013-08-01 MED ORDER — SUFENTANIL CITRATE 50 MCG/ML IV SOLN
INTRAVENOUS | Status: DC | PRN
  Administered 2013-08-01: 20 ug via INTRAVENOUS
  Administered 2013-08-01 (×3): 10 ug via INTRAVENOUS

## 2013-08-01 MED ORDER — ONDANSETRON HCL 4 MG/2ML IJ SOLN
INTRAMUSCULAR | Status: DC | PRN
  Administered 2013-08-01: 4 mg via INTRAVENOUS

## 2013-08-01 MED ORDER — SIMVASTATIN 20 MG PO TABS
20.0000 mg | ORAL_TABLET | Freq: Every evening | ORAL | Status: DC
  Administered 2013-08-01 – 2013-08-02 (×2): 20 mg via ORAL
  Filled 2013-08-01 (×3): qty 1

## 2013-08-01 MED ORDER — RIVAROXABAN 10 MG PO TABS
10.0000 mg | ORAL_TABLET | Freq: Every day | ORAL | Status: DC
  Administered 2013-08-02 – 2013-08-03 (×2): 10 mg via ORAL
  Filled 2013-08-01 (×3): qty 1

## 2013-08-01 MED ORDER — DEXAMETHASONE SODIUM PHOSPHATE 10 MG/ML IJ SOLN
INTRAMUSCULAR | Status: DC | PRN
  Administered 2013-08-01: 10 mg via INTRAVENOUS

## 2013-08-01 MED ORDER — KETAMINE HCL 10 MG/ML IJ SOLN
INTRAMUSCULAR | Status: DC | PRN
  Administered 2013-08-01 (×3): 10 mg via INTRAVENOUS
  Administered 2013-08-01: 30 mg via INTRAVENOUS

## 2013-08-01 MED ORDER — LIDOCAINE HCL (CARDIAC) 20 MG/ML IV SOLN
INTRAVENOUS | Status: DC | PRN
  Administered 2013-08-01: 100 mg via INTRAVENOUS

## 2013-08-01 MED ORDER — INSULIN ASPART 100 UNIT/ML ~~LOC~~ SOLN
0.0000 [IU] | Freq: Three times a day (TID) | SUBCUTANEOUS | Status: DC
  Administered 2013-08-01 – 2013-08-03 (×3): 3 [IU] via SUBCUTANEOUS

## 2013-08-01 MED ORDER — METHOCARBAMOL 100 MG/ML IJ SOLN
500.0000 mg | Freq: Four times a day (QID) | INTRAVENOUS | Status: DC | PRN
  Administered 2013-08-01: 500 mg via INTRAVENOUS
  Filled 2013-08-01: qty 5

## 2013-08-01 MED ORDER — CEFAZOLIN SODIUM-DEXTROSE 2-3 GM-% IV SOLR
INTRAVENOUS | Status: AC
  Filled 2013-08-01: qty 50

## 2013-08-01 MED ORDER — SODIUM CHLORIDE 0.9 % IJ SOLN
INTRAMUSCULAR | Status: DC | PRN
  Administered 2013-08-01: 40 mL

## 2013-08-01 MED ORDER — FERROUS SULFATE 325 (65 FE) MG PO TABS
325.0000 mg | ORAL_TABLET | Freq: Three times a day (TID) | ORAL | Status: DC
  Administered 2013-08-01 – 2013-08-03 (×7): 325 mg via ORAL
  Filled 2013-08-01 (×9): qty 1

## 2013-08-01 MED ORDER — ONDANSETRON HCL 4 MG PO TABS: 4.0000 mg | ORAL_TABLET | Freq: Four times a day (QID) | ORAL | Status: DC | PRN

## 2013-08-01 MED ORDER — SODIUM CHLORIDE 0.9 % IJ SOLN
INTRAMUSCULAR | Status: AC
  Filled 2013-08-01: qty 50

## 2013-08-01 MED ORDER — FENTANYL CITRATE 0.05 MG/ML IJ SOLN
INTRAMUSCULAR | Status: AC
  Filled 2013-08-01: qty 2

## 2013-08-01 MED ORDER — PROPOFOL 10 MG/ML IV BOLUS
INTRAVENOUS | Status: DC | PRN
  Administered 2013-08-01: 150 mg via INTRAVENOUS

## 2013-08-01 MED ORDER — OXYCODONE-ACETAMINOPHEN 5-325 MG PO TABS
2.0000 | ORAL_TABLET | ORAL | Status: DC | PRN
  Administered 2013-08-02: 01:00:00 2 via ORAL
  Filled 2013-08-01: qty 2

## 2013-08-01 MED ORDER — LACTATED RINGERS IV SOLN
INTRAVENOUS | Status: DC
  Administered 2013-08-01 (×2): via INTRAVENOUS

## 2013-08-01 MED ORDER — METFORMIN HCL 500 MG PO TABS
1000.0000 mg | ORAL_TABLET | Freq: Two times a day (BID) | ORAL | Status: DC
  Administered 2013-08-01 – 2013-08-03 (×4): 1000 mg via ORAL
  Filled 2013-08-01 (×6): qty 2

## 2013-08-01 SURGICAL SUPPLY — 67 items
ADH SKN CLS APL DERMABOND .7 (GAUZE/BANDAGES/DRESSINGS) ×1
BAG SPEC THK2 15X12 ZIP CLS (MISCELLANEOUS) ×1
BAG ZIPLOCK 12X15 (MISCELLANEOUS) ×2 IMPLANT
BANDAGE ELASTIC 4 VELCRO ST LF (GAUZE/BANDAGES/DRESSINGS) ×2 IMPLANT
BANDAGE ELASTIC 6 VELCRO ST LF (GAUZE/BANDAGES/DRESSINGS) ×2 IMPLANT
BANDAGE ESMARK 6X9 LF (GAUZE/BANDAGES/DRESSINGS) ×1 IMPLANT
BLADE SAG 18X100X1.27 (BLADE) ×2 IMPLANT
BLADE SAW SGTL 11.0X1.19X90.0M (BLADE) ×2 IMPLANT
BNDG CMPR 9X6 STRL LF SNTH (GAUZE/BANDAGES/DRESSINGS) ×1
BNDG COHESIVE 4X5 TAN NS LF (GAUZE/BANDAGES/DRESSINGS) ×1 IMPLANT
BNDG ESMARK 6X9 LF (GAUZE/BANDAGES/DRESSINGS) ×2
BONE CEMENT GENTAMICIN (Cement) ×4 IMPLANT
CAPT RP KNEE ×1 IMPLANT
CEMENT BONE GENTAMICIN 40 (Cement) ×2 IMPLANT
CLOTH BEACON ORANGE TIMEOUT ST (SAFETY) ×1 IMPLANT
CUFF TOURN SGL QUICK 34 (TOURNIQUET CUFF) ×2
CUFF TRNQT CYL 34X4X40X1 (TOURNIQUET CUFF) ×1 IMPLANT
DERMABOND ADVANCED (GAUZE/BANDAGES/DRESSINGS) ×1
DERMABOND ADVANCED .7 DNX12 (GAUZE/BANDAGES/DRESSINGS) ×1 IMPLANT
DRAPE EXTREMITY T 121X128X90 (DRAPE) ×2 IMPLANT
DRAPE INCISE IOBAN 66X45 STRL (DRAPES) ×2 IMPLANT
DRAPE LG THREE QUARTER DISP (DRAPES) ×2 IMPLANT
DRAPE POUCH INSTRU U-SHP 10X18 (DRAPES) ×2 IMPLANT
DRAPE U-SHAPE 47X51 STRL (DRAPES) ×2 IMPLANT
DRSG AQUACEL AG ADV 3.5X10 (GAUZE/BANDAGES/DRESSINGS) ×2 IMPLANT
DRSG PAD ABDOMINAL 8X10 ST (GAUZE/BANDAGES/DRESSINGS) ×8 IMPLANT
DRSG TEGADERM 4X4.75 (GAUZE/BANDAGES/DRESSINGS) ×2 IMPLANT
DURAPREP 26ML APPLICATOR (WOUND CARE) ×2 IMPLANT
ELECT REM PT RETURN 9FT ADLT (ELECTROSURGICAL) ×2
ELECTRODE REM PT RTRN 9FT ADLT (ELECTROSURGICAL) ×1 IMPLANT
EVACUATOR 1/8 PVC DRAIN (DRAIN) ×2 IMPLANT
GAUZE SPONGE 2X2 8PLY STRL LF (GAUZE/BANDAGES/DRESSINGS) ×1 IMPLANT
GLOVE BIOGEL PI IND STRL 8 (GLOVE) ×1 IMPLANT
GLOVE BIOGEL PI INDICATOR 8 (GLOVE) ×1
GLOVE ECLIPSE 8.0 STRL XLNG CF (GLOVE) ×4 IMPLANT
GLOVE SURG SS PI 6.5 STRL IVOR (GLOVE) ×4 IMPLANT
GOWN PREVENTION PLUS LG XLONG (DISPOSABLE) ×6 IMPLANT
GOWN STRL REIN XL XLG (GOWN DISPOSABLE) ×2 IMPLANT
HANDPIECE INTERPULSE COAX TIP (DISPOSABLE) ×2
IMMOBILIZER KNEE 20 (SOFTGOODS) ×2
IMMOBILIZER KNEE 20 THIGH 36 (SOFTGOODS) IMPLANT
KIT BASIN OR (CUSTOM PROCEDURE TRAY) ×2 IMPLANT
MANIFOLD NEPTUNE II (INSTRUMENTS) ×2 IMPLANT
NEEDLE HYPO 22GX1.5 SAFETY (NEEDLE) ×2 IMPLANT
NS IRRIG 1000ML POUR BTL (IV SOLUTION) ×2 IMPLANT
PACK TOTAL JOINT (CUSTOM PROCEDURE TRAY) ×2 IMPLANT
PADDING CAST COTTON 6X4 STRL (CAST SUPPLIES) ×2 IMPLANT
POSITIONER SURGICAL ARM (MISCELLANEOUS) ×2 IMPLANT
SET HNDPC FAN SPRY TIP SCT (DISPOSABLE) ×1 IMPLANT
SPONGE GAUZE 2X2 STER 10/PKG (GAUZE/BANDAGES/DRESSINGS) ×1
SPONGE LAP 18X18 X RAY DECT (DISPOSABLE) ×2 IMPLANT
SPONGE SURGIFOAM ABS GEL 100 (HEMOSTASIS) ×2 IMPLANT
STAPLER VISISTAT 35W (STAPLE) IMPLANT
SUT BONE WAX W31G (SUTURE) ×1 IMPLANT
SUT MNCRL AB 4-0 PS2 18 (SUTURE) ×2 IMPLANT
SUT VIC AB 1 CT1 27 (SUTURE) ×4
SUT VIC AB 1 CT1 27XBRD ANTBC (SUTURE) ×2 IMPLANT
SUT VIC AB 2-0 CT1 27 (SUTURE) ×6
SUT VIC AB 2-0 CT1 TAPERPNT 27 (SUTURE) ×2 IMPLANT
SUT VLOC 180 0 24IN GS25 (SUTURE) ×1 IMPLANT
SYR 20CC LL (SYRINGE) ×2 IMPLANT
TOWEL OR 17X26 10 PK STRL BLUE (TOWEL DISPOSABLE) ×4 IMPLANT
TOWER CARTRIDGE SMART MIX (DISPOSABLE) ×2 IMPLANT
TRAY FOLEY CATH 14FRSI W/METER (CATHETERS) ×1 IMPLANT
TRAY FOLEY CATH 16FRSI W/METER (SET/KITS/TRAYS/PACK) ×1 IMPLANT
WATER STERILE IRR 1500ML POUR (IV SOLUTION) ×2 IMPLANT
WRAP KNEE MAXI GEL POST OP (GAUZE/BANDAGES/DRESSINGS) ×4 IMPLANT

## 2013-08-01 NOTE — Brief Op Note (Signed)
08/01/2013  10:22 AM  PATIENT:  [name]  72 y.o. male  PRE-OPERATIVE DIAGNOSIS:  Osteoarthritis of the Right Knee  POST-OPERATIVE DIAGNOSIS:  Osteoarthritis of the Right Knee  PROCEDURE:  Procedure(s): RIGHT TOTAL KNEE ARTHROPLASTY (Right)  SURGEON:  Surgeon(s) and Role:    * Jacki Cones, MD - Primary  PHYSICIAN ASSISTANT:Amber Redwater PA  ASSISTANTS: Dimitri Ped PA}   ANESTHESIA:   general  EBL:  Total I/O In: 1000 [I.V.:1000] Out: 100 [Urine:100]  BLOOD ADMINISTERED:none  DRAINS: (One) Hemovact drain(s) in the Right Knee with  Suction Open   LOCAL MEDICATIONS USED:  BUPIVICAINE 20cc mixesd with 40cc of Normal Saline  SPECIMEN:  No Specimen  DISPOSITION OF SPECIMEN:  N/A  COUNTS:  YES  TOURNIQUET:  * Missing tourniquet times found for documented tourniquets in log:  [id number] *  DICTATION: .Other Dictation: Dictation Number [id number]  PLAN OF CARE: Admit to inpatient   PATIENT DISPOSITION:  Stable in OR   Delay start of Pharmacological VTE agent (>24hrs) due to surgical blood loss or risk of bleeding: yes

## 2013-08-01 NOTE — Anesthesia Preprocedure Evaluation (Addendum)
Anesthesia Evaluation  Patient identified by MRN, date of birth, ID band Patient awake    Reviewed: Allergy & Precautions, H&P , NPO status , Patient's Chart, lab work & pertinent test results  Airway Mallampati: II TM Distance: >3 FB Neck ROM: Full    Dental no notable dental hx.    Pulmonary neg pulmonary ROS,  breath sounds clear to auscultation  Pulmonary exam normal       Cardiovascular hypertension, Pt. on medications negative cardio ROS  Rhythm:Regular Rate:Normal     Neuro/Psych negative neurological ROS  negative psych ROS   GI/Hepatic negative GI ROS, Neg liver ROS,   Endo/Other  negative endocrine ROSdiabetes, Type 2, Oral Hypoglycemic Agents  Renal/GU negative Renal ROS  negative genitourinary   Musculoskeletal negative musculoskeletal ROS (+)   Abdominal   Peds negative pediatric ROS (+)  Hematology negative hematology ROS (+)   Anesthesia Other Findings   Reproductive/Obstetrics negative OB ROS                           Anesthesia Physical  Anesthesia Plan  ASA: II  Anesthesia Plan: General   Post-op Pain Management:    Induction: Intravenous  Airway Management Planned: Oral ETT  Additional Equipment:   Intra-op Plan:   Post-operative Plan: Extubation in OR  Informed Consent: I have reviewed the patients History and Physical, chart, labs and discussed the procedure including the risks, benefits and alternatives for the proposed anesthesia with the patient or authorized representative who has indicated his/her understanding and acceptance.   Dental advisory given  Plan Discussed with: CRNA  Anesthesia Plan Comments:        Anesthesia Quick Evaluation

## 2013-08-01 NOTE — Anesthesia Postprocedure Evaluation (Signed)
  Anesthesia Post-op Note  Patient: [name]  Procedure(s) Performed: Procedure(s) (LRB): RIGHT TOTAL KNEE ARTHROPLASTY (Right)  Patient Location: PACU  Anesthesia Type: General  Level of Consciousness: awake and alert   Airway and Oxygen Therapy: Patient Spontanous Breathing  Post-op Pain: mild  Post-op Assessment: Post-op Vital signs reviewed, Patient's Cardiovascular Status Stable, Respiratory Function Stable, Patent Airway and No signs of Nausea or vomiting  Last Vitals:  Filed Vitals:   08/01/13 1213  BP: 155/91  Pulse: 85  Temp: 36.7 C  Resp: 16    Post-op Vital Signs: stable   Complications: No apparent anesthesia complications

## 2013-08-01 NOTE — Interval H&P Note (Signed)
History and Physical Interval Note:  08/01/2013 8:12 AM  [name]  has presented today for surgery, with the diagnosis of Osteoarthritis of the Right Knee  The various methods of treatment have been discussed with the patient and family. After consideration of risks, benefits and other options for treatment, the patient has consented to  Procedure(s): RIGHT TOTAL KNEE ARTHROPLASTY (Right) as a surgical intervention .  The patient's history has been reviewed, patient examined, no change in status, stable for surgery.  I have reviewed the patient's chart and labs.  Questions were answered to the patient's satisfaction.     [name] A

## 2013-08-01 NOTE — Anesthesia Procedure Notes (Signed)
Procedure Name: Intubation Date/Time: 08/01/2013 8:45 AM Performed by: Leroy Libman L Patient Re-evaluated:Patient Re-evaluated prior to inductionOxygen Delivery Method: Circle system utilized Preoxygenation: Pre-oxygenation with 100% oxygen Intubation Type: IV induction Ventilation: Mask ventilation without difficulty and Oral airway inserted - appropriate to patient size Laryngoscope Size: Miller and 3 Grade View: Grade II Tube type: Oral Tube size: 8.0 mm Number of attempts: 1 Airway Equipment and Method: Stylet Placement Confirmation: ETT inserted through vocal cords under direct vision,  breath sounds checked- equal and bilateral and positive ETCO2 Secured at: 22 cm Tube secured with: Tape Dental Injury: Teeth and Oropharynx as per pre-operative assessment

## 2013-08-01 NOTE — Plan of Care (Signed)
Problem: Phase I Progression Outcomes Goal: Other Phase I Outcomes/Goals Outcome: Completed/Met Date Met:  08/01/13 Voiding after foley dc'd

## 2013-08-01 NOTE — Transfer of Care (Signed)
Immediate Anesthesia Transfer of Care Note  Patient: [name]  Procedure(s) Performed: Procedure(s): RIGHT TOTAL KNEE ARTHROPLASTY (Right)  Patient Location: PACU  Anesthesia Type:General  Level of Consciousness: sedated  Airway & Oxygen Therapy: Patient Spontanous Breathing and Patient connected to face mask oxygen  Post-op Assessment: Report given to PACU RN and Post -op Vital signs reviewed and stable  Post vital signs: Reviewed and stable  Complications: No apparent anesthesia complications

## 2013-08-01 NOTE — Preoperative (Signed)
Beta Blockers   Reason not to administer Beta Blockers:Not Applicable 

## 2013-08-01 NOTE — Progress Notes (Signed)
Utilization review completed.  

## 2013-08-02 DIAGNOSIS — D62 Acute posthemorrhagic anemia: Secondary | ICD-10-CM | POA: Diagnosis not present

## 2013-08-02 LAB — CBC
HCT: 30.9 % — ABNORMAL LOW (ref 39.0–52.0)
Platelets: 190 10*3/uL (ref 150–400)
RBC: 3.42 MIL/uL — ABNORMAL LOW (ref 4.22–5.81)
RDW: 13.4 % (ref 11.5–15.5)
WBC: 12.6 10*3/uL — ABNORMAL HIGH (ref 4.0–10.5)

## 2013-08-02 LAB — BASIC METABOLIC PANEL
Calcium: 8.7 mg/dL (ref 8.4–10.5)
Chloride: 103 mEq/L (ref 96–112)
GFR calc Af Amer: >90 mL/min (ref >90)
Potassium: 4.1 mEq/L (ref 3.5–5.1)
Sodium: 139 mEq/L (ref 135–145)

## 2013-08-02 LAB — GLUCOSE, CAPILLARY
Glucose-Capillary: 121 mg/dL — ABNORMAL HIGH (ref 70–99)
Glucose-Capillary: 148 mg/dL — ABNORMAL HIGH (ref 70–99)
Glucose-Capillary: 159 mg/dL — ABNORMAL HIGH (ref 70–99)
Glucose-Capillary: 106 mg/dL — ABNORMAL HIGH (ref 70–99)

## 2013-08-02 MED ORDER — METHOCARBAMOL 500 MG PO TABS: 500.0000 mg | ORAL_TABLET | Freq: Four times a day (QID) | ORAL | Status: DC | PRN

## 2013-08-02 MED ORDER — INFLUENZA VAC SPLIT QUAD 0.5 ML IM SUSP
0.5000 mL | INTRAMUSCULAR | Status: AC
  Administered 2013-08-03: 12:00:00 0.5 mL via INTRAMUSCULAR
  Filled 2013-08-02 (×2): qty 0.5

## 2013-08-02 MED ORDER — OXYCODONE HCL 5 MG PO TABS: 5.0000 mg | ORAL_TABLET | ORAL | Status: DC | PRN

## 2013-08-02 MED ORDER — FERROUS SULFATE 325 (65 FE) MG PO TABS: 325.0000 mg | ORAL_TABLET | Freq: Three times a day (TID) | ORAL | Status: DC

## 2013-08-02 MED ORDER — RIVAROXABAN 10 MG PO TABS: 10.0000 mg | ORAL_TABLET | Freq: Every day | ORAL | Status: DC

## 2013-08-02 NOTE — Progress Notes (Signed)
Subjective: 1 Day Post-Op Procedure(s) (LRB): RIGHT TOTAL KNEE ARTHROPLASTY (Right) Patient reports pain as 4 on 0-10 scale. Hemovac Dcd. Doing well this A.M.   Objective: Vital signs in last 24 hours: Temp:  [97.6 F (36.4 C)-98.6 F (37 C)] 97.9 F (36.6 C) (10/02 0501) Pulse Rate:  [75-97] 75 (10/02 0501) Resp:  [14-16] 16 (10/02 0501) BP: (109-159)/(56-91) 109/56 mmHg (10/02 0501) SpO2:  [7 %-99 %] 97 % (10/02 0501) Weight:  [84.823 kg (187 lb)] 84.823 kg (187 lb) (10/01 0906)  Intake/Output from previous day: 10/01 0701 - 10/02 0700 In: 3955 [P.O.:480; I.V.:3375; IV Piggyback:100] Out: 2570 [Urine:1885; Drains:635; Blood:50] Intake/Output this shift:     Recent Labs  08/02/13 0448  HGB 10.4*    Recent Labs  08/02/13 0448  WBC 12.6*  RBC 3.42*  HCT 30.9*  PLT 190    Recent Labs  08/02/13 0448  NA 139  K 4.1  CL 103  CO2 27  BUN 24*  CREATININE 0.95  GLUCOSE 166*  CALCIUM 8.7   No results found for this basename: LABPT, INR,  in the last 72 hours  Dorsiflexion/Plantar flexion intact No cellulitis present Compartment soft  Assessment/Plan: 1 Day Post-Op Procedure(s) (LRB): RIGHT TOTAL KNEE ARTHROPLASTY (Right) Up with therapy Plan  on DC tomorrow.  Khalik Pewitt A 08/02/2013, 7:18 AM

## 2013-08-02 NOTE — Evaluation (Signed)
Physical Therapy Evaluation Patient Details Name: [name] MRN: [id number] DOB: 07/05/1941 Today's Date: 08/02/2013 Time: 4098-1191 PT Time Calculation (min): 24 min  PT Assessment / Plan / Recommendation History of Present Illness  s/p right TKA  Clinical Impression  Pt will benefit from PT to address deficits below     PT Assessment  Patient needs continued PT services    Follow Up Recommendations  Home health PT    Does the patient have the potential to tolerate intense rehabilitation      Barriers to Discharge        Equipment Recommendations  None recommended by PT    Recommendations for Other Services     Frequency 7X/week    Precautions / Restrictions Precautions Required Braces or Orthoses: Knee Immobilizer - Right Restrictions Weight Bearing Restrictions: Yes RLE Weight Bearing: Weight bearing as tolerated   Pertinent Vitals/Pain Pain 4/10 right knee, premedicated      Mobility  Bed Mobility Bed Mobility: Supine to Sit Supine to Sit: 4: Min assist Details for Bed Mobility Assistance: verbal cues for technique Transfers Transfers: Sit to Stand;Stand to Sit Sit to Stand: 4: Min assist;4: Min guard Stand to Sit: 4: Min assist;4: Min guard Details for Transfer Assistance: verbal cues for hand placement Ambulation/Gait Ambulation/Gait Assistance: 4: Min Environmental consultant (Feet): 120 Feet Assistive device: Rolling walker Ambulation/Gait Assistance Details: cues for sequence and RW safety Gait Pattern: Step-to pattern;Antalgic    Exercises Total Joint Exercises Ankle Circles/Pumps: AROM;Both;10 reps   PT Diagnosis: Difficulty walking  PT Problem List: Decreased strength;Decreased range of motion;Decreased activity tolerance;Decreased balance;Decreased mobility;Decreased knowledge of use of DME PT Treatment Interventions: DME instruction;Gait training;Functional mobility training;Therapeutic activities;Therapeutic exercise;Patient/family  education     PT Goals(Current goals can be found in the care plan section) Acute Rehab PT Goals PT Goal Formulation: With patient Time For Goal Achievement: 08/09/13 Potential to Achieve Goals: Good  Visit Information  Last PT Received On: 08/02/13 Assistance Needed: +1 History of Present Illness: s/p right TKA       Prior Functioning  Home Living Family/patient expects to be discharged to:: Private residence Living Arrangements: Spouse/significant other Type of Home: House Home Access: Stairs to enter Secretary/administrator of Steps: 2 Home Layout: One level Home Equipment: Environmental consultant - 2 wheels;Cane - single point Prior Function Level of Independence: Independent;Independent with assistive device(s) Comments: using cane due to pain in knee Communication Communication: No difficulties    Cognition  Cognition Arousal/Alertness: Awake/alert Behavior During Therapy: WFL for tasks assessed/performed Overall Cognitive Status: Within Functional Limits for tasks assessed    Extremity/Trunk Assessment Lower Extremity Assessment Lower Extremity Assessment: RLE deficits/detail RLE Deficits / Details: ankle WFL, knee flexion limited due to pain and edema (~12-35* flexion); LLE WFL   Balance    End of Session PT - End of Session Equipment Utilized During Treatment: Gait belt Activity Tolerance: Patient tolerated treatment well Patient left: in chair Nurse Communication: Mobility status  GP     [facility] 08/02/2013, 12:48 PM

## 2013-08-02 NOTE — Op Note (Signed)
NAMEZENON, LEAF               ACCOUNT NO.:  [id number]  MEDICAL RECORD NO.:  [id number]  LOCATION:  1606                         FACILITY:  [facility]  PHYSICIAN:  Georges Lynch. Jaisen Wiltrout, M.D.DATE OF BIRTH:  Oct 11, 1941  DATE OF PROCEDURE:  08/01/2013 DATE OF DISCHARGE:                              OPERATIVE REPORT   SURGEON:  Georges Lynch. Darrelyn Hillock, M.D.  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  POSTOPERATIVE DIAGNOSIS:  Severe osteoarthritis with bone-on-bone right knee.  He had a mild flexion contracture as well.  DESCRIPTION OF PROCEDURE:  The appropriate time-out was first carried out.  I then marked the appropriate right leg in the holding area. After the appropriate time-out, the appropriate prep and draping were carried out in usual fashion.  The right leg was exsanguinated with an Esmarch.  Tourniquet was elevated at 300 mmHg.  The knee was flexed and an anterior approach of the knee was carried out.  Two flaps were created.  I then carried out a median parapatellar incision and reflected the patella laterally, flexed the knee, and did mediolateral meniscectomies and also excised the anterior and posterior cruciate ligaments.  At this particular time, attention was 1st directed to the femur.  I removed 13 mm of thickness off the distal femur mainly because of his contracture.  Also at this time, I then measured the femur to be a size 4.  We cut the femur for the anterior-posterior chamfering cuts for a size 4 right femur.  Following that, I then prepared the tibia in usual fashion.  A 6 mm thickness was removed from the medial side of the tibia where the defect was.  Note, this was a substantial defect in the tibia.  At this point, after removing the bone from our 1st bone cuts to the tibia.  We then inserted the lamina spreaders and completed our soft tissue releases.  There were no significant bone spurs  present posteriorly on the femur.  I thoroughly irrigated the area out.  I then inserted my spacer guide for my measuring my tension gaps.  We felt that the 12.5 a little loose, so we went to a 15 and had excellent stability and excellent flexion extension.  We then did a resurfacing procedure on the patella after we inserted our trial components.  Prior to inserting the trial components, I did my appropriate keel cut out of the proximal tibial plateau and femoral notch cut.  We water picked out the knee again and then inserted our trial components.  We had excellent stability with a 15, we first tried a 12.5, and a 15-mm thickness inserted.  I then did a resurfacing procedure on the patella for a size 38 patella.  Three drill holes were made in the articular surface of the patella.  All trial components were removed.  We thoroughly water picked out the knee dried the knee uncemented all 3 components simultaneously. At this time, after the cement was hardened, we removed all loose pieces of cement.  I water picked the posterior aspect of the knee out again. I then started my injection with a mixture of 20 mL  of Exparel with 40 mL of normal saline.  We used half of that mixture at the beginning, I then inserted some thrombin-soaked Gelfoam on the posterior aspect of the tibia to control bleeding.  At this time, we then inserted our permanent rotating platform size 4, 15 mm thickness reduced the knee. We got excellent mediolateral stability and good flexion and extension as well.  I then inserted Hemovac drain and closed the wound layers in usual fashion.  The sterile dressings were applied.          ______________________________ Georges Lynch Darrelyn Hillock, M.D.     RAG/MEDQ  D:  08/01/2013  T:  08/02/2013  Job:  [id number]

## 2013-08-02 NOTE — Care Management Note (Addendum)
    Page 1 of 1   08/03/2013     4:27:06 PM   CARE MANAGEMENT NOTE 08/03/2013  Patient:  [name], [name]   Account Number:  [id number]  Date Initiated:  08/02/2013  Documentation initiated by:  Colleen Can  Subjective/Objective Assessment:   dx OA right knee; total knee replacemnt    Doctor's office referred patient to [facility] services.     Action/Plan:   CM spoke with patient and spouse. Plans are for patient to return to [facility] where spouse will be caregiver. Pt has RW and cane . Genevieve Norlander will provide [facility] services.   Anticipated DC Date:  08/03/2013   Anticipated DC Plan:  HOME W HOME HEALTH SERVICES      DC Planning Services  CM consult      [facility] Choice  HOME HEALTH   Choice offered to / List presented to:  C-1 Patient        HH arranged  HH-2 PT      [facility] agency  [facility]   Status of service:  Completed, signed off Medicare Important Message given?  NA - LOS <3 / Initial given by admissions (If response is "NO", the following Medicare IM given date fields will be blank) Date Medicare IM given:   Date Additional Medicare IM given:    Discharge Disposition:  HOME W HOME HEALTH SERVICES  Per UR Regulation:    If discussed at Long Length of Stay Meetings, dates discussed:    Comments:  08/02/2013 Colleen Can BSN RN CCM [phone] Genevieve Norlander will provide [facility] services with start date of day after pt is discharged.

## 2013-08-02 NOTE — Progress Notes (Signed)
Physical Therapy Treatment Patient Details Name: [name] MRN: [id number] DOB: 09-08-1941 Today's Date: 08/02/2013 Time: 9147-8295 PT Time Calculation (min): 22 min  PT Assessment / Plan / Recommendation  History of Present Illness s/p right TKA   PT Comments   Pt progressing well although ther ex limited this pm due to incr pain;   Follow Up Recommendations  Home health PT     Does the patient have the potential to tolerate intense rehabilitation     Barriers to Discharge        Equipment Recommendations  None recommended by PT    Recommendations for Other Services    Frequency 7X/week   Progress towards PT Goals Progress towards PT goals: Progressing toward goals  Plan Current plan remains appropriate    Precautions / Restrictions Precautions Precautions: Knee Required Braces or Orthoses: Knee Immobilizer - Right Knee Immobilizer - Right: Discontinue once straight leg raise with < 10 degree lag Restrictions RLE Weight Bearing: Weight bearing as tolerated   Pertinent Vitals/Pain Pain 9/10 RN present with meds    Mobility  Bed Mobility Bed Mobility: Not assessed Transfers Transfers: Sit to Stand;Stand to Sit Sit to Stand: 4: Min guard;5: Supervision Stand to Sit: 4: Min guard;5: Supervision Details for Transfer Assistance: verbal cues for hand placement Ambulation/Gait Ambulation/Gait Assistance: 5: Supervision;4: Min guard Ambulation Distance (Feet): 120 Feet Assistive device: Rolling walker Ambulation/Gait Assistance Details: cues for sequence and RW safety Gait Pattern: Step-to pattern;Antalgic    Exercises Total Joint Exercises Ankle Circles/Pumps: AROM;Both;10 reps Quad Sets: AROM;Strengthening;Both;10 reps Knee Flexion: AAROM;AROM;10 reps;Right;Seated Goniometric ROM: 57*   PT Diagnosis:    PT Problem List:   PT Treatment Interventions:     PT Goals (current goals can now be found in the care plan section) Acute Rehab PT Goals Time For Goal  Achievement: 08/09/13 Potential to Achieve Goals: Good  Visit Information  Last PT Received On: 08/02/13 Assistance Needed: +1 History of Present Illness: s/p right TKA    Subjective Data      Cognition  Cognition Arousal/Alertness: Awake/alert Behavior During Therapy: WFL for tasks assessed/performed Overall Cognitive Status: Within Functional Limits for tasks assessed    Balance     End of Session PT - End of Session Equipment Utilized During Treatment: Gait belt Activity Tolerance: Patient tolerated treatment well Patient left: in chair;with call bell/phone within reach;with family/visitor present Nurse Communication: Mobility status;Patient requests pain meds   GP     [facility] 08/02/2013, 5:34 PM

## 2013-08-03 ENCOUNTER — Encounter (HOSPITAL_COMMUNITY): Payer: Self-pay | Admitting: Orthopedic Surgery

## 2013-08-03 LAB — CBC
MCH: 31.1 pg (ref 26.0–34.0)
MCHC: 34.1 g/dL (ref 30.0–36.0)
MCV: 91 fL (ref 78.0–100.0)
Platelets: 186 10*3/uL (ref 150–400)
RDW: 13.6 % (ref 11.5–15.5)

## 2013-08-03 LAB — BASIC METABOLIC PANEL
CO2: 29 mEq/L (ref 19–32)
Calcium: 8.8 mg/dL (ref 8.4–10.5)
Creatinine, Ser: 0.86 mg/dL (ref 0.50–1.35)
GFR calc non Af Amer: 86 mL/min — ABNORMAL LOW (ref >90)
Sodium: 138 mEq/L (ref 135–145)

## 2013-08-03 LAB — GLUCOSE, CAPILLARY
Glucose-Capillary: 184 mg/dL — ABNORMAL HIGH (ref 70–99)
Glucose-Capillary: 80 mg/dL (ref 70–99)

## 2013-08-03 NOTE — Progress Notes (Signed)
Pt to d/c home with [facility] home health. No DME needs. Second therapy session much better per PT. Nonsymptomatic. BP stable. AVS reviewed and "My Chart" discussed with pt. Pt capable of verbalizing medications and follow-up appointments. Remains hemodynamically stable. No signs and symptoms of distress. Educated pt to return to ER in the case of SOB, dizziness, or chest pain.

## 2013-08-03 NOTE — Progress Notes (Signed)
Pt not reporting any symptoms of nausea or dizziness. BP rechecked and was 137/75. PA aware of situation. PT to work with pt again before d/c to ensure BP remains stable.

## 2013-08-03 NOTE — Progress Notes (Signed)
When working with PT this morning, pt reported feelings of nausea and asked to sit down. Blood sugar checked and was 111, BP was 68/45. Pt assisted to the chair. After sitting for a few minutes, BP was rechecked and was 121/72. PA notified. No new orders received and this time. Will continue to monitor pt.

## 2013-08-03 NOTE — Progress Notes (Signed)
Physical Therapy Treatment Patient Details Name: [name] MRN: [id number] DOB: [birth year] Today's Date: 08/03/2013 Time: 1191-4782 PT Time Calculation (min): 32 min  PT Assessment / Plan / Recommendation  History of Present Illness s/p right TKA   PT Comments   POD # 2 am session.  Applied KI and instructed pt on use for amb.  Assisted pt OOB to amb in hallway.  Practiced steps when pt c/o MAX nausea and urgency to sit.  Pt was found to be hypotensive, reported to RN.  Will allow pt time to rest and return.    Follow Up Recommendations  Home health PT      Does the patient have the potential to tolerate intense rehabilitation     Barriers to Discharge        Equipment Recommendations       Recommendations for Other Services    Frequency 7X/week   Progress towards PT Goals Progress towards PT goals: Progressing toward goals  Plan      Precautions / Restrictions Precautions Precautions: Knee Precaution Comments: Instructed pt on KI use for amb  Required Braces or Orthoses: Knee Immobilizer - Right Knee Immobilizer - Right: Discontinue once straight leg raise with < 10 degree lag Restrictions Weight Bearing Restrictions: No RLE Weight Bearing: Weight bearing as tolerated    Pertinent Vitals/Pain C/o 4/10 pain C/o MAX nausea    Mobility  Bed Mobility Bed Mobility: Supine to Sit Supine to Sit: 4: Min assist Details for Bed Mobility Assistance: verbal cues for technique and increased time Transfers Transfers: Sit to Stand;Stand to Sit Sit to Stand: 4: Min guard;4: Min assist;From bed;From chair/3-in-1 Stand to Sit: 4: Min assist;4: Min guard;To chair/3-in-1 Details for Transfer Assistance: verbal cues for hand placement and R LE management Ambulation/Gait Ambulation/Gait Assistance: 5: Supervision;4: Min guard Ambulation Distance (Feet): 35 Feet Assistive device: Rolling walker Ambulation/Gait Assistance Details: 25% Vc's to increase WB thru R LE and proper  walker to self distance.  After performing stairs, pt c/o MAX nausea and some dizzyness with urgency to sit.   Gait Pattern: Step-to pattern;Antalgic Gait velocity: decreased     PT Goals (current goals can now be found in the care plan section) Acute Rehab PT Goals Patient Stated Goal: home when ready  Visit Information  Last PT Received On: 08/03/13 Assistance Needed: +1 History of Present Illness: s/p right TKA    Subjective Data  Patient Stated Goal: home when ready   Cognition  Cognition Arousal/Alertness: Awake/alert Behavior During Therapy: WFL for tasks assessed/performed Overall Cognitive Status: Within Functional Limits for tasks assessed    Balance  Balance Balance Assessed: Yes Dynamic Standing Balance Dynamic Standing - Level of Assistance: 4: Min assist (min guard)  End of Session PT - End of Session Equipment Utilized During Treatment: Gait belt Activity Tolerance: Other (comment) (BP dropped see vital signs) Patient left: in chair;with call bell/phone within reach;with family/visitor present Nurse Communication:  (hypotension)   Felecia Shelling  PTA [facility]  Acute  Rehab Pager      [phone]

## 2013-08-03 NOTE — Progress Notes (Signed)
Physical Therapy Treatment Patient Details Name: [name] MRN: [id number] DOB: 09-25-1941 Today's Date: 08/03/2013 Time: 1203-1216 PT Time Calculation (min): 13 min  PT Assessment / Plan / Recommendation  History of Present Illness s/p right TKA   PT Comments   Pm session pt performed much better.  No c/o and BP maintained 150/79 in recliner then 163/73 after amb. Performed steps with spouse(educated/demonstrated.  Instructed spouse on KI use for amb and steps.     Follow Up Recommendations  Home health PT     Does the patient have the potential to tolerate intense rehabilitation     Barriers to Discharge        Equipment Recommendations       Recommendations for Other Services    Frequency 7X/week   Progress towards PT Goals Progress towards PT goals: Progressing toward goals  Plan      Precautions / Restrictions Precautions Precautions: Knee Precaution Comments: Instructed pt on KI use for amb  Required Braces or Orthoses: Knee Immobilizer - Right Knee Immobilizer - Right: Discontinue once straight leg raise with < 10 degree lag Restrictions Weight Bearing Restrictions: No RLE Weight Bearing: Weight bearing as tolerated    Pertinent Vitals/Pain C/o 4/10 during session ICE applied    Mobility  Bed Mobility Bed Mobility: Not assessed Supine to Sit: 4: Min assist Details for Bed Mobility Assistance: Pt OOB in recliner Transfers Transfers: Sit to Stand;Stand to Sit Sit to Stand: 5: Supervision;From chair/3-in-1 Stand to Sit: 5: Supervision;To chair/3-in-1 Details for Transfer Assistance: much better performed Ambulation/Gait Ambulation/Gait Assistance: 5: Supervision Ambulation Distance (Feet): 75 Feet Assistive device: Rolling walker Ambulation/Gait Assistance Details: much better performed with no c/o.  Spouse present and assisted Gait Pattern: Step-to pattern;Antalgic Gait velocity: decreased Stairs: Yes Stairs Assistance: 4: Min assist Stairs  Assistance Details (indicate cue type and reason): with spouse and 50% VC's on proper tech Stair Management Technique: Backwards;No rails Number of Stairs: 2     PT Goals (current goals can now be found in the care plan section) Acute Rehab PT Goals Patient Stated Goal: home when ready  Visit Information  Last PT Received On: 08/03/13 Assistance Needed: +1 History of Present Illness: s/p right TKA    Subjective Data  Patient Stated Goal: home when ready   Cognition  Cognition Arousal/Alertness: Awake/alert Behavior During Therapy: WFL for tasks assessed/performed Overall Cognitive Status: Within Functional Limits for tasks assessed    Balance  Balance Balance Assessed: Yes Dynamic Standing Balance Dynamic Standing - Level of Assistance: 4: Min assist (min guard)  End of Session PT - End of Session Equipment Utilized During Treatment: Gait belt;Right knee immobilizer Activity Tolerance: Patient tolerated treatment well Patient left: in chair;with call bell/phone within reach;with family/visitor present Nurse Communication:  (hypotension)   Felecia Shelling  PTA [facility]  Acute  Rehab Pager      [phone]

## 2013-08-03 NOTE — Progress Notes (Signed)
Discharge summary sent to payer through [facility]  

## 2013-08-03 NOTE — Progress Notes (Signed)
   Subjective: 2 Days Post-Op Procedure(s) (LRB): RIGHT TOTAL KNEE ARTHROPLASTY (Right) Patient reports pain as mild.   Patient seen in rounds without Dr. Darrelyn Hillock. Patient is well, and has had no acute complaints or problems. He has some discomfort in the right knee but reports that he is feeling better today. No issues overnight. No SOB or chest pain.  Plan is to go Home after hospital stay.  Objective: Vital signs in last 24 hours: Temp:  [96.3 F (35.7 C)-98.6 F (37 C)] 98.6 F (37 C) (10/03 0548) Pulse Rate:  [70-80] 80 (10/02 2220) Resp:  [16] 16 (10/03 0548) BP: (131-155)/(74-81) 131/77 mmHg (10/03 0548) SpO2:  [96 %-98 %] 96 % (10/03 0548)  Intake/Output from previous day:  Intake/Output Summary (Last 24 hours) at 08/03/13 0714 Last data filed at 08/03/13 0001  Gross per 24 hour  Intake 1039.34 ml  Output   2150 ml  Net -1110.66 ml     Labs:  Recent Labs  08/02/13 0448 08/03/13 0412  HGB 10.4* 10.0*    Recent Labs  08/02/13 0448 08/03/13 0412  WBC 12.6* 9.1  RBC 3.42* 3.22*  HCT 30.9* 29.3*  PLT 190 186    Recent Labs  08/02/13 0448 08/03/13 0412  NA 139 138  K 4.1 3.9  CL 103 103  CO2 27 29  BUN 24* 20  CREATININE 0.95 0.86  GLUCOSE 166* 117*  CALCIUM 8.7 8.8    EXAM General - Patient is Alert and Oriented Extremity - Neurologically intact Neurovascular intact Intact pulses distally Dorsiflexion/Plantar flexion intact No cellulitis present Compartment soft Dressing - dressing C/D/I Motor Function - intact, moving foot and toes well on exam.   Past Medical History  Diagnosis Date  . Acute meniscal tear of knee RIGHT KNEE  . Hypertension   . Hyperlipidemia   . Diabetes mellitus, type 2   . Arthritis THUMB  . Harrel's cyst of knee RIGHT    remains at present 07-26-13    Assessment/Plan: 2 Days Post-Op Procedure(s) (LRB): RIGHT TOTAL KNEE ARTHROPLASTY (Right) Active Problems:   Acute blood loss anemia  Estimated body mass  index is 31.12 kg/(m^2) as calculated from the following:   Height as of this encounter: 5\' 5"  (1.651 m).   Weight as of this encounter: 84.823 kg (187 lb). Advance diet Up with therapy D/C IV fluids Discharge home with home health  DVT Prophylaxis - Xarelto Weight-Bearing as tolerated    [name] is doing well today. Will do therapy and then discharge home this morning. Discharge instructions given. Will follow up in 2 weeks in office.   [name] LAUREN 08/03/2013, 7:14 AM

## 2013-08-03 NOTE — Evaluation (Signed)
Occupational Therapy Evaluation Patient Details Name: [name] MRN: [id number] DOB: 11/27/1940 Today's Date: 08/03/2013 Time: 9562-1308 OT Time Calculation (min): 19 min  OT Assessment / Plan / Recommendation History of present illness s/p right TKA   Clinical Impression   Wife present for education. Reviewed KI wear and how to don/doff. Pt doing well and all OT education completed. No further OT needs.     OT Assessment  Patient does not need any further OT services    Follow Up Recommendations  No OT follow up;Supervision/Assistance - 24 hour    Barriers to Discharge      Equipment Recommendations  None recommended by OT    Recommendations for Other Services    Frequency       Precautions / Restrictions Precautions Precautions: Knee Required Braces or Orthoses: Knee Immobilizer - Right Knee Immobilizer - Right: Discontinue once straight leg raise with < 10 degree lag Restrictions Weight Bearing Restrictions: No RLE Weight Bearing: Weight bearing as tolerated   Pertinent Vitals/Pain 5/10; reposition, ice    ADL  Eating/Feeding: Simulated;Independent Where Assessed - Eating/Feeding: Chair Grooming: Simulated;Wash/dry hands;Set up Where Assessed - Grooming: Supported sitting Upper Body Bathing: Simulated;Chest;Right arm;Abdomen;Left arm;Set up Where Assessed - Upper Body Bathing: Unsupported sitting Lower Body Bathing: Simulated;Minimal assistance Where Assessed - Lower Body Bathing: Supported sit to stand Upper Body Dressing: Simulated;Set up Where Assessed - Upper Body Dressing: Unsupported sitting Lower Body Dressing: Simulated;Moderate assistance Where Assessed - Lower Body Dressing: Supported sit to stand Toilet Transfer: Performed;Min Psychologist, sport and exercise: Comfort height toilet;Grab bars Toileting - Architect and Hygiene: Simulated;Min guard Where Assessed - Engineer, mining and Hygiene: Standing Equipment Used:  Long-handled shoe horn;Long-handled sponge;Reacher;Rolling walker;Sock aid ADL Comments: Pt's wife present for session. Educated on [place] and when to wear and how to don/doff. He has a Sports administrator and reviewed all AE with pt/spouse but he states wife will help with LB self care. Discussed safety with showering and recommended to sponge bathe initially and let HHPT assess when pt ready to step over tub. Pt currently not lifting R LE up on his own to safely step into a tub. Pt and wife agreeable to let [facility] assess. Pt states he has a vanity next to toilet at home and can use that to push up and down from commode.     OT Diagnosis:    OT Problem List:   OT Treatment Interventions:     OT Goals(Current goals can be found in the care plan section) Acute Rehab OT Goals Patient Stated Goal: home when ready  Visit Information  Last OT Received On: 08/03/13 Assistance Needed: +1 History of Present Illness: s/p right TKA       Prior Functioning     Home Living Family/patient expects to be discharged to:: Private residence Living Arrangements: Spouse/significant other Type of Home: House Home Access: Stairs to enter [employer] of Steps: 2 Home Layout: One level Home Equipment: Environmental consultant - 2 wheels;Cane - single point;Adaptive equipment;Shower Engineering geologist: Reacher Prior Function Level of Independence: Independent;Independent with assistive device(s) Comments: using cane due to pain in knee Communication Communication: No difficulties         Vision/Perception     Cognition  Cognition Arousal/Alertness: Awake/alert Behavior During Therapy: WFL for tasks assessed/performed Overall Cognitive Status: Within Functional Limits for tasks assessed    Extremity/Trunk Assessment Upper Extremity Assessment Upper Extremity Assessment: Overall WFL for tasks assessed     Mobility Transfers Transfers: Sit to Stand;Stand to  Sit Sit to Stand: 4: Min guard;With upper extremity  assist;From chair/3-in-1;From toilet Stand to Sit: 4: Min guard;To chair/3-in-1;To toilet;With upper extremity assist Details for Transfer Assistance: verbal cues for hand placement and R LE management.     Exercise     Balance Balance Balance Assessed: Yes Dynamic Standing Balance Dynamic Standing - Level of Assistance: 4: Min assist (min guard)   End of Session OT - End of Session Equipment Utilized During Treatment: Gait belt;Rolling walker Activity Tolerance: Patient tolerated treatment well Patient left: in chair;with call bell/phone within reach  GO     Lennox Laity [phone] 08/03/2013, 11:29 AM

## 2013-08-06 NOTE — Discharge Summary (Signed)
Physician Discharge Summary   Patient ID: [name] MRN: [id number] DOB/AGE: [birth year] 70 y.o.  Admit date: 08/01/2013 Discharge date: 08/03/2013  Primary Diagnosis: Osteoarthritis, right knee  Admission Diagnoses:  Past Medical History  Diagnosis Date  . Acute meniscal tear of knee RIGHT KNEE  . Hypertension   . Hyperlipidemia   . Diabetes mellitus, type 2   . Arthritis THUMB  . Heady's cyst of knee RIGHT    remains at present 07-26-13   Discharge Diagnoses:   Active Problems:   Acute blood loss anemia S/P right total knee arthroplasty  Estimated body mass index is 31.12 kg/(m^2) as calculated from the following:   Height as of this encounter: 5\' 5"  (1.651 m).   Weight as of this encounter: 84.823 kg (187 lb).  Procedure:  Procedure(s) (LRB): RIGHT TOTAL KNEE ARTHROPLASTY (Right)   Consults: None  HPI: [name], 72 y.o. male, has a history of pain and functional disability in the right knee due to arthritis and has failed non-surgical conservative treatments for greater than 12 weeks to includeNSAID's and/or analgesics, corticosteriod injections, viscosupplementation injections, use of assistive devices and activity modification. Onset of symptoms was gradual, starting 1 year ago with gradually worsening course since that time. The patient noted prior procedures on the knee to include arthroscopy and menisectomy on the right knee(s). Patient currently rates pain in the right knee(s) at 6 out of 10 with activity. Patient has night pain, worsening of pain with activity and weight bearing, pain that interferes with activities of daily living, pain with passive range of motion, crepitus and joint swelling. Patient has evidence of periarticular osteophytes and joint space narrowing by imaging studies. There is no active infection.   Laboratory Data: Admission on 08/01/2013, Discharged on 08/03/2013  Component Date Value Range Status  . Glucose-Capillary 08/01/2013 86  70 -  99 mg/dL Final  . Comment 1 40/98/1191 Documented in Chart   Final  . Glucose-Capillary 08/01/2013 130* 70 - 99 mg/dL Final  . Comment 1 47/82/9562 Documented in Chart   Final  . Comment 2 08/01/2013 Notify RN   Final  . WBC 08/02/2013 12.6* 4.0 - 10.5 K/uL Final  . RBC 08/02/2013 3.42* 4.22 - 5.81 MIL/uL Final  . Hemoglobin 08/02/2013 10.4* 13.0 - 17.0 g/dL Final  . HCT 13/06/6577 30.9* 39.0 - 52.0 % Final  . MCV 08/02/2013 90.4  78.0 - 100.0 fL Final  . MCH 08/02/2013 30.4  26.0 - 34.0 pg Final  . MCHC 08/02/2013 33.7  30.0 - 36.0 g/dL Final  . RDW 46/96/2952 13.4  11.5 - 15.5 % Final  . Platelets 08/02/2013 190  150 - 400 K/uL Final  . Sodium 08/02/2013 139  135 - 145 mEq/L Final  . Potassium 08/02/2013 4.1  3.5 - 5.1 mEq/L Final  . Chloride 08/02/2013 103  96 - 112 mEq/L Final  . CO2 08/02/2013 27  19 - 32 mEq/L Final  . Glucose, Bld 08/02/2013 166* 70 - 99 mg/dL Final  . BUN 84/13/2440 24* 6 - 23 mg/dL Final  . Creatinine, Ser 08/02/2013 0.95  0.50 - 1.35 mg/dL Final  . Calcium 08/28/2535 8.7  8.4 - 10.5 mg/dL Final  . GFR calc non Af Amer 08/02/2013 82* >90 mL/min Final  . GFR calc Af Amer 08/02/2013 >90  >90 mL/min Final   Comment: (NOTE)                          The eGFR  has been calculated using the CKD EPI equation.                          This calculation has not been validated in all clinical situations.                          eGFR's persistently <90 mL/min signify possible Chronic Kidney                          Disease.  . Glucose-Capillary 08/01/2013 179* 70 - 99 mg/dL Final  . Glucose-Capillary 08/01/2013 153* 70 - 99 mg/dL Final  . Glucose-Capillary 08/02/2013 121* 70 - 99 mg/dL Final  . Glucose-Capillary 08/02/2013 148* 70 - 99 mg/dL Final  . Glucose-Capillary 08/02/2013 119* 70 - 99 mg/dL Final  . Glucose-Capillary 08/02/2013 159* 70 - 99 mg/dL Final  . WBC 54/07/8118 9.1  4.0 - 10.5 K/uL Final  . RBC 08/03/2013 3.22* 4.22 - 5.81 MIL/uL Final  . Hemoglobin  08/03/2013 10.0* 13.0 - 17.0 g/dL Final  . HCT 14/78/2956 29.3* 39.0 - 52.0 % Final  . MCV 08/03/2013 91.0  78.0 - 100.0 fL Final  . MCH 08/03/2013 31.1  26.0 - 34.0 pg Final  . MCHC 08/03/2013 34.1  30.0 - 36.0 g/dL Final  . RDW 21/30/8657 13.6  11.5 - 15.5 % Final  . Platelets 08/03/2013 186  150 - 400 K/uL Final  . Sodium 08/03/2013 138  135 - 145 mEq/L Final  . Potassium 08/03/2013 3.9  3.5 - 5.1 mEq/L Final  . Chloride 08/03/2013 103  96 - 112 mEq/L Final  . CO2 08/03/2013 29  19 - 32 mEq/L Final  . Glucose, Bld 08/03/2013 117* 70 - 99 mg/dL Final  . BUN 84/69/6295 20  6 - 23 mg/dL Final  . Creatinine, Ser 08/03/2013 0.86  0.50 - 1.35 mg/dL Final  . Calcium 28/41/3244 8.8  8.4 - 10.5 mg/dL Final  . GFR calc non Af Amer 08/03/2013 86* >90 mL/min Final  . GFR calc Af Amer 08/03/2013 >90  >90 mL/min Final   Comment: (NOTE)                          The eGFR has been calculated using the CKD EPI equation.                          This calculation has not been validated in all clinical situations.                          eGFR's persistently <90 mL/min signify possible Chronic Kidney                          Disease.  . Glucose-Capillary 08/02/2013 106* 70 - 99 mg/dL Final  . Glucose-Capillary 08/03/2013 184* 70 - 99 mg/dL Final  . Glucose-Capillary 08/03/2013 111* 70 - 99 mg/dL Final  . Glucose-Capillary 08/03/2013 80  70 - 99 mg/dL Final  Hospital Outpatient Visit on 07/26/2013  Component Date Value Range Status  . MRSA, PCR 07/26/2013 NEGATIVE  NEGATIVE Final  . Staphylococcus aureus 07/26/2013 NEGATIVE  NEGATIVE Final   Comment:  The Xpert SA Assay (FDA                          approved for NASAL specimens                          in patients over 70 years of age),                          is one component of                          a comprehensive surveillance                          program.  Test performance has                          been  validated by [employer] for patients greater                          than or equal to 75 year old.                          It is not intended                          to diagnose infection nor to                          guide or monitor treatment.  Marland Kitchen aPTT 07/26/2013 27  24 - 37 seconds Final  . Sodium 07/26/2013 139  135 - 145 mEq/L Final  . Potassium 07/26/2013 4.1  3.5 - 5.1 mEq/L Final  . Chloride 07/26/2013 102  96 - 112 mEq/L Final  . CO2 07/26/2013 27  19 - 32 mEq/L Final  . Glucose, Bld 07/26/2013 104* 70 - 99 mg/dL Final  . BUN 16/08/9603 19  6 - 23 mg/dL Final  . Creatinine, Ser 07/26/2013 0.82  0.50 - 1.35 mg/dL Final  . Calcium 54/07/8118 9.2  8.4 - 10.5 mg/dL Final  . Total Protein 07/26/2013 6.7  6.0 - 8.3 g/dL Final  . Albumin 14/78/2956 3.8  3.5 - 5.2 g/dL Final  . AST 21/30/8657 13  0 - 37 U/L Final  . ALT 07/26/2013 17  0 - 53 U/L Final  . Alkaline Phosphatase 07/26/2013 40  39 - 117 U/L Final  . Total Bilirubin 07/26/2013 0.4  0.3 - 1.2 mg/dL Final  . GFR calc non Af Amer 07/26/2013 88* >90 mL/min Final  . GFR calc Af Amer 07/26/2013 >90  >90 mL/min Final   Comment: (NOTE)                          The eGFR has been calculated using the CKD EPI equation.                          This calculation has not been validated in all clinical situations.  eGFR's persistently <90 mL/min signify possible Chronic Kidney                          Disease.  Marland Kitchen Prothrombin Time 07/26/2013 12.3  11.6 - 15.2 seconds Final  . INR 07/26/2013 0.93  0.00 - 1.49 Final  . ABO/RH(D) 07/26/2013 A NEG   Final  . Antibody Screen 07/26/2013 NEG   Final  . Sample Expiration 07/26/2013 08/04/2013   Final  . Color, Urine 07/26/2013 YELLOW  YELLOW Final  . APPearance 07/26/2013 CLEAR  CLEAR Final  . Specific Gravity, Urine 07/26/2013 1.020  1.005 - 1.030 Final  . pH 07/26/2013 6.0  5.0 - 8.0 Final  . Glucose, UA 07/26/2013 NEGATIVE  NEGATIVE  mg/dL Final  . Hgb urine dipstick 07/26/2013 NEGATIVE  NEGATIVE Final  . Bilirubin Urine 07/26/2013 NEGATIVE  NEGATIVE Final  . Ketones, ur 07/26/2013 NEGATIVE  NEGATIVE mg/dL Final  . Protein, ur 16/08/9603 NEGATIVE  NEGATIVE mg/dL Final  . Urobilinogen, UA 07/26/2013 1.0  0.0 - 1.0 mg/dL Final  . Nitrite 54/07/8118 NEGATIVE  NEGATIVE Final  . Leukocytes, UA 07/26/2013 NEGATIVE  NEGATIVE Final   MICROSCOPIC NOT DONE ON URINES WITH NEGATIVE PROTEIN, BLOOD, LEUKOCYTES, NITRITE, OR GLUCOSE <1000 mg/dL.  . WBC 07/26/2013 6.8  4.0 - 10.5 K/uL Final  . RBC 07/26/2013 4.58  4.22 - 5.81 MIL/uL Final  . Hemoglobin 07/26/2013 14.0  13.0 - 17.0 g/dL Final  . HCT 14/78/2956 41.2  39.0 - 52.0 % Final  . MCV 07/26/2013 90.0  78.0 - 100.0 fL Final  . MCH 07/26/2013 30.6  26.0 - 34.0 pg Final  . MCHC 07/26/2013 34.0  30.0 - 36.0 g/dL Final  . RDW 21/30/8657 13.0  11.5 - 15.5 % Final  . Platelets 07/26/2013 194  150 - 400 K/uL Final  . ABO/RH(D) 07/26/2013 A NEG   Final     X-Rays:Dg Chest 2 View  07/26/2013   CLINICAL DATA:  Preoperative evaluation  EXAM: CHEST  2 VIEW  COMPARISON:  None.  FINDINGS: The lungs are clear. Heart size and pulmonary vascularity are normal. No adenopathy. No bone lesions.  IMPRESSION: No edema or consolidation.   Electronically Signed   By: Bretta Bang   On: 07/26/2013 09:54   Dg Knee Right Port  08/01/2013   CLINICAL DATA:  Post right total knee arthroplasty  EXAM: PORTABLE RIGHT KNEE - 1-2 VIEW  COMPARISON:  Portable exam 1141 hr correlated with prior MRI out 07/29/2012  FINDINGS: Components of right total knee arthroplasty identified in expected positions.  Curvilinear density projects at the knee joint on the AP view, not definitely localized on the lateral view, uncertain etiology and significance.  No acute fracture, dislocation or bone destruction.  Surgical drain and expected postsurgical soft tissue changes present.  IMPRESSION: Curvilinear density of uncertain  etiology and significance projects at the right knee joint.  Unremarkable right knee prosthesis.   Electronically Signed   By: Ulyses Southward M.D.   On: 08/01/2013 12:30    EKG: Orders placed during the hospital encounter of 08/01/13  . EKG     Hospital Course: [name] is a 72 y.o. who was admitted to [facility]. They were brought to the operating room on 08/01/2013 and underwent Procedure(s): RIGHT TOTAL KNEE ARTHROPLASTY.  Patient tolerated the procedure well and was later transferred to the recovery room and then to the orthopaedic floor for postoperative care.  They were given PO and  IV analgesics for pain control following their surgery.  They were given 24 hours of postoperative antibiotics of  Anti-infectives   Start     Dose/Rate Route Frequency Ordered Stop   08/01/13 1500  ceFAZolin (ANCEF) IVPB 1 g/50 mL premix     1 g 100 mL/hr over 30 Minutes Intravenous Every 6 hours 08/01/13 1225 08/01/13 2259   08/01/13 0919  polymyxin B 500,000 Units, bacitracin 50,000 Units in sodium chloride irrigation 0.9 % 500 mL irrigation  Status:  Discontinued       As needed 08/01/13 0920 08/01/13 1055   08/01/13 0646  ceFAZolin (ANCEF) IVPB 2 g/50 mL premix     2 g 100 mL/hr over 30 Minutes Intravenous On call to O.R. 08/01/13 1610 08/01/13 0850     and started on DVT prophylaxis in the form of Xarelto.   PT and OT were ordered for total joint protocol.  Discharge planning consulted to help with postop disposition and equipment needs.  Patient had a good night on the evening of surgery.  They started to get up OOB with therapy on day one. Hemovac drain was pulled without difficulty.  Continued to work with therapy into day two.  Dressing remained clean and dry. Tthe patient had progressed with therapy and meeting their goals.  Incision was healing well.  Patient was seen in rounds and was ready to go home. After a session of therapy, he had some difficulty with hypotension. After lunch,  patient did well with therapy with no issues regarding BP.    Discharge Medications: Prior to Admission medications   Medication Sig Start Date End Date Taking? Authorizing Provider  glimepiride (AMARYL) 1 MG tablet Take 1 mg by mouth daily before breakfast.   Yes Historical Provider, MD  losartan (COZAAR) 100 MG tablet Take 100 mg by mouth every morning.    Yes Historical Provider, MD  metFORMIN (GLUCOPHAGE) 1000 MG tablet Take 1,000 mg by mouth 2 (two) times daily with a meal.   Yes Historical Provider, MD  ferrous sulfate 325 (65 FE) MG tablet Take 1 tablet (325 mg total) by mouth 3 (three) times daily after meals. 08/02/13   Nickie Warwick Tamala Ser, PA-C  methocarbamol (ROBAXIN) 500 MG tablet Take 1 tablet (500 mg total) by mouth every 6 (six) hours as needed. 08/02/13   Marguis Mathieson Tamala Ser, PA-C  oxyCODONE (ROXICODONE) 5 MG immediate release tablet Take 1-3 tablets (5-15 mg total) by mouth every 4 (four) hours as needed for pain. 08/02/13   Trany Chernick Tamala Ser, PA-C  rivaroxaban (XARELTO) 10 MG TABS tablet Take 1 tablet (10 mg total) by mouth daily with breakfast. 08/02/13   Addilyn Satterwhite Tamala Ser, PA-C  simvastatin (ZOCOR) 20 MG tablet Take 20 mg by mouth every evening.    Historical Provider, MD    Diet: Diabetic diet Activity:WBAT Follow-up:in 2 weeks Disposition - Home Discharged Condition: good   Discharge Orders   Future Orders Complete By Expires   Call MD / Call 911  As directed    Comments:     If you experience chest pain or shortness of breath, CALL 911 and be transported to the hospital emergency room.  If you develope a fever above 101 F, pus (white drainage) or increased drainage or redness at the wound, or calf pain, call your surgeon's office.   Constipation Prevention  As directed    Comments:     Drink plenty of fluids.  Prune juice may be helpful.  You may use a  stool softener, such as Colace (over the counter) 100 mg twice a day.  Use MiraLax (over the counter)  for constipation as needed.   Diet Carb Modified  As directed    Discharge instructions  As directed    Comments:     Walk with your walker. Weight bearing as tolerated. Home Health Agency will follow you at home for your therapy  Do not remove dressing unless there is excess drainage Shower only, no tub bath. Call if any temperatures greater than 101 or any wound complications: [phone] during the day and ask for Dr. Jeannetta Ellis nurse, [facility].   Do not put a pillow under the knee. Place it under the heel.  As directed    Driving restrictions  As directed    Comments:     No driving   Increase activity slowly as tolerated  As directed        Medication List    STOP taking these medications       aspirin 325 MG tablet     vitamin C 500 MG tablet  Commonly known as:  ASCORBIC ACID     Vitamin D-3 1000 UNITS Caps     vitamin E 400 UNIT capsule  Generic drug:  vitamin E      TAKE these medications       ferrous sulfate 325 (65 FE) MG tablet  Take 1 tablet (325 mg total) by mouth 3 (three) times daily after meals.     glimepiride 1 MG tablet  Commonly known as:  AMARYL  Take 1 mg by mouth daily before breakfast.     losartan 100 MG tablet  Commonly known as:  COZAAR  Take 100 mg by mouth every morning.     metFORMIN 1000 MG tablet  Commonly known as:  GLUCOPHAGE  Take 1,000 mg by mouth 2 (two) times daily with a meal.     methocarbamol 500 MG tablet  Commonly known as:  ROBAXIN  Take 1 tablet (500 mg total) by mouth every 6 (six) hours as needed.     oxyCODONE 5 MG immediate release tablet  Commonly known as:  ROXICODONE  Take 1-3 tablets (5-15 mg total) by mouth every 4 (four) hours as needed for pain.     rivaroxaban 10 MG Tabs tablet  Commonly known as:  XARELTO  Take 1 tablet (10 mg total) by mouth daily with breakfast.     simvastatin 20 MG tablet  Commonly known as:  ZOCOR  Take 20 mg by mouth every evening.           Follow-up Information     Follow up with GIOFFRE,RONALD A, MD. Schedule an appointment as soon as possible for a visit in 2 weeks.   Specialty:  Orthopedic Surgery   Contact information:   [address] [place] [address] [phone]       Signed: Kerby Nora 08/06/2013, 8:38 AM

## 2014-07-10 ENCOUNTER — Other Ambulatory Visit: Payer: Self-pay | Admitting: Surgical

## 2014-07-10 MED ORDER — DEXAMETHASONE SODIUM PHOSPHATE 10 MG/ML IJ SOLN: 10.0000 mg | Freq: Once | INTRAMUSCULAR | Status: AC

## 2014-07-22 ENCOUNTER — Encounter (HOSPITAL_COMMUNITY): Payer: Self-pay | Admitting: Pharmacy Technician

## 2014-07-25 NOTE — Patient Instructions (Addendum)
[name]  07/26/2014                           YOUR PROCEDURE IS SCHEDULED ON:  07/31/14               ENTER THRU [facility] MAIN HOSPITAL ENTRANCE AND                           FOLLOW  SIGNS TO SHORT STAY CENTER                 ARRIVE AT SHORT STAY AT: 5:30 AM               CALL THIS NUMBER IF ANY PROBLEMS THE DAY OF SURGERY :               [phone]                                REMEMBER:   Do not eat food or drink liquids AFTER MIDNIGHT                  Take these medicines the morning of surgery with               A SIPS OF WATER :   NONE        Do not wear jewelry, make-up   Do not wear lotions, powders, or perfumes.   Do not shave legs or underarms 12 hrs. before surgery (men may shave face)  Do not bring valuables to the hospital.  Contacts, dentures or bridgework may not be worn into surgery.  Leave suitcase in the car. After surgery it may be brought to your room.  For patients admitted to the hospital more than one night, checkout time is            11:00 AM                                         ________________________________________________________________________                                                                                                  [place] [facility] - PREPARING FOR SURGERY  Before surgery, you can play an important role.  Because skin is not sterile, your skin needs to be as free of germs as possible.  You can reduce the number of germs on your skin by washing with CHG (chlorahexidine gluconate) soap before surgery.  CHG is an antiseptic cleaner which kills germs and bonds with the skin to continue killing germs even after washing. Please DO NOT use if you have an allergy to CHG or antibacterial soaps.  If your skin becomes reddened/irritated stop using the CHG and inform your nurse when you arrive at Short Stay. Do not shave (including legs and underarms) for at least 48 hours  prior to the first CHG shower.  You may  shave your face. Please follow these instructions carefully:   1.  Shower with CHG Soap the night before surgery and the  morning of Surgery.   2.  If you choose to wash your hair, wash your hair first as usual with your  normal  Shampoo.   3.  After you shampoo, rinse your hair and body thoroughly to remove the  shampoo.                                         4.  Use CHG as you would any other liquid soap.  You can apply chg directly  to the skin and wash . Gently wash with scrungie or clean wascloth    5.  Apply the CHG Soap to your body ONLY FROM THE NECK DOWN.   Do not use on open                           Wound or open sores. Avoid contact with eyes, ears mouth and genitals (private parts).                        Genitals (private parts) with your normal soap.              6.  Wash thoroughly, paying special attention to the area where your surgery  will be performed.   7.  Thoroughly rinse your body with warm water from the neck down.   8.  DO NOT shower/wash with your normal soap after using and rinsing off  the CHG Soap .                9.  Pat yourself dry with a clean towel.             10.  Wear clean pajamas.             11.  Place clean sheets on your bed the night of your first shower and do not  sleep with pets.  Day of Surgery : Do not apply any lotions/deodorants the morning of surgery.  Please wear clean clothes to the hospital/surgery center.  FAILURE TO FOLLOW THESE INSTRUCTIONS MAY RESULT IN THE CANCELLATION OF YOUR SURGERY    PATIENT SIGNATURE_________________________________  ______________________________________________________________________     [name]  An incentive spirometer is a tool that can help keep your lungs clear and active. This tool measures how well you are filling your lungs with each breath. Taking long deep breaths may help reverse or decrease the chance of developing breathing (pulmonary) problems (especially  infection) following:  A long period of time when you are unable to move or be active. BEFORE THE PROCEDURE   If the spirometer includes an indicator to show your best effort, your nurse or respiratory therapist will set it to a desired goal.  If possible, sit up straight or lean slightly forward. Try not to slouch.  Hold the incentive spirometer in an upright position. INSTRUCTIONS FOR USE  1. Sit on the edge of your bed if possible, or sit up as far as you can in bed or on a chair. 2. Hold the incentive spirometer in an upright position. 3. Breathe out normally. 4. Place the mouthpiece in your mouth and seal your lips tightly  around it. 5. Breathe in slowly and as deeply as possible, raising the piston or the ball toward the top of the column. 6. Hold your breath for 3-5 seconds or for as long as possible. Allow the piston or ball to fall to the bottom of the column. 7. Remove the mouthpiece from your mouth and breathe out normally. 8. Rest for a few seconds and repeat Steps 1 through 7 at least 10 times every 1-2 hours when you are awake. Take your time and take a few normal breaths between deep breaths. 9. The spirometer may include an indicator to show your best effort. Use the indicator as a goal to work toward during each repetition. 10. After each set of 10 deep breaths, practice coughing to be sure your lungs are clear. If you have an incision (the cut made at the time of surgery), support your incision when coughing by placing a pillow or rolled up towels firmly against it. Once you are able to get out of bed, walk around indoors and cough well. You may stop using the incentive spirometer when instructed by your caregiver.  RISKS AND COMPLICATIONS  Take your time so you do not get dizzy or light-headed.  If you are in pain, you may need to take or ask for pain medication before doing incentive spirometry. It is harder to take a deep breath if you are having pain. AFTER  USE  Rest and breathe slowly and easily.  It can be helpful to keep track of a log of your progress. Your caregiver can provide you with a simple table to help with this. If you are using the spirometer at home, follow these instructions: [place] IF:   You are having difficultly using the spirometer.  You have trouble using the spirometer as often as instructed.  Your pain medication is not giving enough relief while using the spirometer.  You develop fever of 100.5 F (38.1 C) or higher. SEEK IMMEDIATE MEDICAL CARE IF:   You cough up bloody sputum that had not been present before.  You develop fever of 102 F (38.9 C) or greater.  You develop worsening pain at or near the incision site. MAKE SURE YOU:   Understand these instructions.  Will watch your condition.  Will get help right away if you are not doing well or get worse. Document Released: 02/28/2007 Document Revised: 01/10/2012 Document Reviewed: 05/01/2007 Exit[employer] Patient Information 2014 [employer], [place].   ________________________________________________________________________  WHAT IS A BLOOD TRANSFUSION? Blood Transfusion Information  A transfusion is the replacement of blood or some of its parts. Blood is made up of multiple cells which provide different functions.  Red blood cells carry oxygen and are used for blood loss replacement.  White blood cells fight against infection.  Platelets control bleeding.  Plasma helps clot blood.  Other blood products are available for specialized needs, such as hemophilia or other clotting disorders. BEFORE THE TRANSFUSION  Who gives blood for transfusions?   Healthy volunteers who are fully evaluated to make sure their blood is safe. This is blood bank blood. Transfusion therapy is the safest it has ever been in the practice of medicine. Before blood is taken from a donor, a complete history is taken to make sure that person has no history of diseases  nor engages in risky social behavior (examples are intravenous drug use or sexual activity with multiple partners). The donor's travel history is screened to minimize risk of transmitting infections, such as malaria. The  donated blood is tested for signs of infectious diseases, such as HIV and hepatitis. The blood is then tested to be sure it is compatible with you in order to minimize the chance of a transfusion reaction. If you or a relative donates blood, this is often done in anticipation of surgery and is not appropriate for emergency situations. It takes many days to process the donated blood. RISKS AND COMPLICATIONS Although transfusion therapy is very safe and saves many lives, the main dangers of transfusion include:   Getting an infectious disease.  Developing a transfusion reaction. This is an allergic reaction to something in the blood you were given. Every precaution is taken to prevent this. The decision to have a blood transfusion has been considered carefully by your caregiver before blood is given. Blood is not given unless the benefits outweigh the risks. AFTER THE TRANSFUSION  Right after receiving a blood transfusion, you will usually feel much better and more energetic. This is especially true if your red blood cells have gotten low (anemic). The transfusion raises the level of the red blood cells which carry oxygen, and this usually causes an energy increase.  The nurse administering the transfusion will monitor you carefully for complications. HOME CARE INSTRUCTIONS  No special instructions are needed after a transfusion. You may find your energy is better. Speak with your caregiver about any limitations on activity for underlying diseases you may have. SEEK MEDICAL CARE IF:   Your condition is not improving after your transfusion.  You develop redness or irritation at the intravenous (IV) site. SEEK IMMEDIATE MEDICAL CARE IF:  Any of the following symptoms occur over the  next 12 hours:  Shaking chills.  You have a temperature by mouth above 102 F (38.9 C), not controlled by medicine.  Chest, back, or muscle pain.  People around you feel you are not acting correctly or are confused.  Shortness of breath or difficulty breathing.  Dizziness and fainting.  You get a rash or develop hives.  You have a decrease in urine output.  Your urine turns a dark color or changes to pink, red, or brown. Any of the following symptoms occur over the next 10 days:  You have a temperature by mouth above 102 F (38.9 C), not controlled by medicine.  Shortness of breath.  Weakness after normal activity.  The white part of the eye turns yellow (jaundice).  You have a decrease in the amount of urine or are urinating less often.  Your urine turns a dark color or changes to pink, red, or brown. Document Released: 10/15/2000 Document Revised: 01/10/2012 Document Reviewed: 06/03/2008 [facility] Patient Information 2014 [place], [place].  _______________________________________________________________________

## 2014-07-25 NOTE — H&P (Signed)
TOTAL KNEE ADMISSION H&P  Patient is being admitted for left total knee arthroplasty.  Subjective:  Chief Complaint:left knee pain.  HPI: [name], 73 y.o. male, has a history of pain and functional disability in the left knee due to arthritis and has failed non-surgical conservative treatments for greater than 12 weeks to includeNSAID's and/or analgesics, corticosteriod injections, viscosupplementation injections, use of assistive devices and activity modification.  Onset of symptoms was gradual, starting 3 years ago with gradually worsening course since that time. The patient noted no past surgery on the left knee(s).  Patient currently rates pain in the left knee(s) at 7 out of 10 with activity. Patient has night pain, worsening of pain with activity and weight bearing, pain that interferes with activities of daily living, pain with passive range of motion, crepitus and joint swelling.  Patient has evidence of periarticular osteophytes and joint space narrowing by imaging studies. There is no active infection.  Patient Active Problem List   Diagnosis Date Noted  . Acute blood loss anemia 08/02/2013  . Primary osteoarthritis of right knee 08/29/2012  . Meniscus, medial, bucket handle tear, old 08/29/2012   Past Medical History  Diagnosis Date  . Acute meniscal tear of knee RIGHT KNEE  . Hypertension   . Hyperlipidemia   . Diabetes mellitus, type 2   . Arthritis THUMB  . Touchette's cyst of knee RIGHT    remains at present 07-26-13    Past Surgical History  Procedure Laterality Date  . Knee arthroscopy  1988 (APPROX)    RIGHT KNEE  . Hernia repair      Hiatal hernia  . Tonsillectomy    . Hand surgery Bilateral     Bilateral thumbs-"tendon repair"  . Vasectomy    . Total knee arthroplasty Right 08/01/2013    Procedure: RIGHT TOTAL KNEE ARTHROPLASTY;  Surgeon: Jacki Cones, MD;  Location: [facility];  Service: Orthopedics;  Laterality: Right;    Current outpatient  prescriptions: acetaminophen (TYLENOL) 650 MG CR tablet, Take 1,300 mg by mouth daily., Disp: , Rfl: ;   ferrous sulfate 325 (65 FE) MG tablet, Take 1 tablet (325 mg total) by mouth 3 (three) times daily after meals., Disp: 45 tablet, Rfl: 0;   glimepiride (AMARYL) 2 MG tablet, Take 1 mg by mouth every morning., Disp: , Rfl: ;   losartan (COZAAR) 100 MG tablet, Take 100 mg by mouth every morning. , Disp: , Rfl:  metFORMIN (GLUCOPHAGE) 1000 MG tablet, Take 1,000 mg by mouth 2 (two) times daily with a meal., Disp: , Rfl: ;   simvastatin (ZOCOR) 20 MG tablet, Take 20 mg by mouth every evening., Disp: , Rfl: ;   traMADol (ULTRAM) 50 MG tablet, Take 50 mg by mouth every 4 (four) hours as needed for moderate pain., Disp: , Rfl:    No Known Allergies  History  Substance Use Topics  . Smoking status: Former Smoker -- 1.00 packs/day for 10 years    Quit date: 07/27/1983  . Smokeless tobacco: Never Used  . Alcohol Use: No     Comment: past -occ. 30 yrs ago    Family History  Problem Relation Age of Onset  . Diabetes Mother   . Diabetes Sister   . Diabetes Brother      Review of Systems  Constitutional: Negative.   HENT: Negative.   Eyes: Negative.   Respiratory: Negative.   Cardiovascular: Negative.   Gastrointestinal: Negative.   Genitourinary: Negative.   Musculoskeletal: Positive for joint pain.  Negative for back pain, falls, myalgias and neck pain.       Left knee pain  Skin: Negative.   Neurological: Negative.   Endo/Heme/Allergies: Negative.   Psychiatric/Behavioral: Negative.     Objective:  Physical Exam  Constitutional: He is oriented to person, place, and time. He appears well-developed. No distress.  Overweight   HENT:  Head: Normocephalic and atraumatic.  Right Ear: External ear normal.  Left Ear: External ear normal.  Nose: Nose normal.  Mouth/Throat: Oropharynx is clear and moist.  Eyes: Conjunctivae and EOM are normal.  Neck: Normal range of motion. Neck  supple.  Cardiovascular: Normal rate, regular rhythm, normal heart sounds and intact distal pulses.   No murmur heard. Respiratory: Effort normal and breath sounds normal. No respiratory distress. He has no wheezes.  GI: Soft. Bowel sounds are normal. He exhibits no distension. There is no tenderness.  Musculoskeletal:       Right hip: Normal.       Left hip: Normal.       Right knee: Normal.       Left knee: He exhibits decreased range of motion and swelling. He exhibits no effusion and no erythema. Tenderness found. Medial joint line and lateral joint line tenderness noted.       Right lower leg: He exhibits no tenderness and no swelling.       Left lower leg: He exhibits no tenderness and no swelling.  Neurological: He is alert and oriented to person, place, and time. He has normal strength and normal reflexes. No sensory deficit.  Skin: No rash noted. He is not diaphoretic. No erythema.  Psychiatric: He has a normal mood and affect. His behavior is normal.   Vitals  Weight: 174 lb Height: 65in Body Surface Area: 1.9 m Body Mass Index: 28.95 kg/m Pulse: 72 (Regular)  BP: 160/80 (Sitting, Left Arm, Standard)   Imaging Review Plain radiographs demonstrate severe degenerative joint disease of the left knee(s). The overall alignment ismild varus. The bone quality appears to be good for age and reported activity level.  Assessment/Plan:  End stage arthritis, left knee   The patient history, physical examination, clinical judgment of the provider and imaging studies are consistent with end stage degenerative joint disease of the left knee(s) and total knee arthroplasty is deemed medically necessary. The treatment options including medical management, injection therapy arthroscopy and arthroplasty were discussed at length. The risks and benefits of total knee arthroplasty were presented and reviewed. The risks due to aseptic loosening, infection, stiffness, patella tracking  problems, thromboembolic complications and other imponderables were discussed. The patient acknowledged the explanation, agreed to proceed with the plan and consent was signed. Patient is being admitted for inpatient treatment for surgery, pain control, PT, OT, prophylactic antibiotics, VTE prophylaxis, progressive ambulation and ADL's and discharge planning. The patient is planning to be discharged home with home health services  TXA  PCP: Dr. Eula Listen  Patient notes: Dilaudid over oxycodone Catheter out evening one Flexeril over Robaxin    [address], PA-C   [place], [place]

## 2014-07-26 ENCOUNTER — Encounter (HOSPITAL_COMMUNITY)
Admission: RE | Admit: 2014-07-26 | Discharge: 2014-07-26 | Disposition: A | Discharge status: Home / Self Care | Payer: Medicare Other | Source: Ambulatory Visit | Attending: Orthopedic Surgery | Admitting: Orthopedic Surgery

## 2014-07-26 ENCOUNTER — Ambulatory Visit (HOSPITAL_COMMUNITY)
Admission: RE | Admit: 2014-07-26 | Discharge: 2014-07-26 | Disposition: A | Discharge status: Home / Self Care | Payer: Medicare Other | Source: Ambulatory Visit | Attending: Surgical | Admitting: Surgical

## 2014-07-26 ENCOUNTER — Encounter (HOSPITAL_COMMUNITY): Payer: Self-pay

## 2014-07-26 DIAGNOSIS — Z01818 Encounter for other preprocedural examination: Secondary | ICD-10-CM | POA: Insufficient documentation

## 2014-07-26 DIAGNOSIS — M23205 Derangement of unspecified medial meniscus due to old tear or injury, unspecified knee: Secondary | ICD-10-CM | POA: Insufficient documentation

## 2014-07-26 DIAGNOSIS — M1710 Unilateral primary osteoarthritis, unspecified knee: Secondary | ICD-10-CM | POA: Insufficient documentation

## 2014-07-26 HISTORY — DX: Psoriasis, unspecified: L40.9

## 2014-07-26 LAB — COMPREHENSIVE METABOLIC PANEL
ALT: 21 U/L (ref 0–53)
AST: 16 U/L (ref 0–37)
Albumin: 4 g/dL (ref 3.5–5.2)
Alkaline Phosphatase: 47 U/L (ref 39–117)
Anion gap: 14 (ref 5–15)
BUN: 22 mg/dL (ref 6–23)
CO2: 26 mEq/L (ref 19–32)
Calcium: 9.8 mg/dL (ref 8.4–10.5)
Chloride: 99 mEq/L (ref 96–112)
Creatinine, Ser: 0.95 mg/dL (ref 0.50–1.35)
GFR calc Af Amer: >90 mL/min (ref >90)
GFR calc non Af Amer: 82 mL/min — ABNORMAL LOW (ref >90)
Glucose, Bld: 82 mg/dL (ref 70–99)
Potassium: 4.6 mEq/L (ref 3.7–5.3)
Sodium: 139 mEq/L (ref 137–147)
Total Bilirubin: 0.4 mg/dL (ref 0.3–1.2)
Total Protein: 7.1 g/dL (ref 6.0–8.3)

## 2014-07-26 LAB — URINALYSIS, ROUTINE W REFLEX MICROSCOPIC
Bilirubin Urine: NEGATIVE
Glucose, UA: NEGATIVE mg/dL
Hgb urine dipstick: NEGATIVE
Ketones, ur: NEGATIVE mg/dL
Leukocytes, UA: NEGATIVE
Nitrite: NEGATIVE
Protein, ur: NEGATIVE mg/dL
Specific Gravity, Urine: 1.023 (ref 1.005–1.030)
Urobilinogen, UA: 0.2 mg/dL (ref 0.0–1.0)
pH: 6 (ref 5.0–8.0)

## 2014-07-26 LAB — CBC
HEMATOCRIT: 40.6 % (ref 39.0–52.0)
Hemoglobin: 13.8 g/dL (ref 13.0–17.0)
MCH: 30.7 pg (ref 26.0–34.0)
MCHC: 34 g/dL (ref 30.0–36.0)
MCV: 90.4 fL (ref 78.0–100.0)
Platelets: 234 10*3/uL (ref 150–400)
RBC: 4.49 MIL/uL (ref 4.22–5.81)
RDW: 13.5 % (ref 11.5–15.5)
WBC: 7.5 10*3/uL (ref 4.0–10.5)

## 2014-07-26 LAB — SURGICAL PCR SCREEN
MRSA, PCR: NEGATIVE
Staphylococcus aureus: NEGATIVE

## 2014-07-26 LAB — PROTIME-INR
INR: 0.95 (ref 0.00–1.49)
Prothrombin Time: 12.7 seconds (ref 11.6–15.2)

## 2014-07-26 LAB — APTT: aPTT: 27 seconds (ref 24–37)

## 2014-07-31 ENCOUNTER — Encounter (HOSPITAL_COMMUNITY): Payer: Medicare Other | Admitting: Anesthesiology

## 2014-07-31 ENCOUNTER — Inpatient Hospital Stay (HOSPITAL_COMMUNITY): Payer: Medicare Other | Admitting: Anesthesiology

## 2014-07-31 ENCOUNTER — Encounter (HOSPITAL_COMMUNITY)
Admission: RE | Disposition: A | Discharge status: Home Health | Payer: Self-pay | Source: Ambulatory Visit | Attending: Orthopedic Surgery

## 2014-07-31 ENCOUNTER — Inpatient Hospital Stay (HOSPITAL_COMMUNITY)
Admission: RE | Admit: 2014-07-31 | Discharge: 2014-08-02 | DRG: 470 | Disposition: A | Discharge status: Home Health | Payer: Medicare Other | Source: Ambulatory Visit | Attending: Orthopedic Surgery | Admitting: Orthopedic Surgery

## 2014-07-31 ENCOUNTER — Encounter (HOSPITAL_COMMUNITY): Payer: Self-pay | Admitting: *Deleted

## 2014-07-31 DIAGNOSIS — E663 Overweight: Secondary | ICD-10-CM | POA: Diagnosis present

## 2014-07-31 DIAGNOSIS — Z87891 Personal history of nicotine dependence: Secondary | ICD-10-CM | POA: Diagnosis not present

## 2014-07-31 DIAGNOSIS — E785 Hyperlipidemia, unspecified: Secondary | ICD-10-CM | POA: Diagnosis present

## 2014-07-31 DIAGNOSIS — I10 Essential (primary) hypertension: Secondary | ICD-10-CM | POA: Diagnosis present

## 2014-07-31 DIAGNOSIS — M1710 Unilateral primary osteoarthritis, unspecified knee: Secondary | ICD-10-CM | POA: Diagnosis not present

## 2014-07-31 DIAGNOSIS — M24562 Contracture, left knee: Secondary | ICD-10-CM | POA: Diagnosis present

## 2014-07-31 DIAGNOSIS — M1712 Unilateral primary osteoarthritis, left knee: Secondary | ICD-10-CM | POA: Diagnosis present

## 2014-07-31 DIAGNOSIS — Z833 Family history of diabetes mellitus: Secondary | ICD-10-CM

## 2014-07-31 DIAGNOSIS — Z96652 Presence of left artificial knee joint: Secondary | ICD-10-CM

## 2014-07-31 DIAGNOSIS — E119 Type 2 diabetes mellitus without complications: Secondary | ICD-10-CM | POA: Diagnosis present

## 2014-07-31 DIAGNOSIS — Z6829 Body mass index (BMI) 29.0-29.9, adult: Secondary | ICD-10-CM

## 2014-07-31 DIAGNOSIS — Z79899 Other long term (current) drug therapy: Secondary | ICD-10-CM

## 2014-07-31 DIAGNOSIS — M179 Osteoarthritis of knee, unspecified: Principal | ICD-10-CM | POA: Diagnosis present

## 2014-07-31 DIAGNOSIS — Z96659 Presence of unspecified artificial knee joint: Secondary | ICD-10-CM

## 2014-07-31 DIAGNOSIS — M24569 Contracture, unspecified knee: Secondary | ICD-10-CM | POA: Diagnosis not present

## 2014-07-31 DIAGNOSIS — M25562 Pain in left knee: Secondary | ICD-10-CM | POA: Diagnosis present

## 2014-07-31 HISTORY — PX: TOTAL KNEE ARTHROPLASTY: SHX125

## 2014-07-31 LAB — GLUCOSE, CAPILLARY
Glucose-Capillary: 129 mg/dL — ABNORMAL HIGH (ref 70–99)
Glucose-Capillary: 154 mg/dL — ABNORMAL HIGH (ref 70–99)
Glucose-Capillary: 182 mg/dL — ABNORMAL HIGH (ref 70–99)
Glucose-Capillary: 169 mg/dL — ABNORMAL HIGH (ref 70–99)
Glucose-Capillary: 178 mg/dL — ABNORMAL HIGH (ref 70–99)
Glucose-Capillary: 203 mg/dL — ABNORMAL HIGH (ref 70–99)

## 2014-07-31 LAB — TYPE AND SCREEN
ABO/RH(D): A NEG
Antibody Screen: NEGATIVE

## 2014-07-31 SURGERY — ARTHROPLASTY, KNEE, TOTAL
Anesthesia: General | Site: Knee | Laterality: Left

## 2014-07-31 MED ORDER — FENTANYL CITRATE 0.05 MG/ML IJ SOLN
INTRAMUSCULAR | Status: AC
  Filled 2014-07-31: qty 2

## 2014-07-31 MED ORDER — FENTANYL CITRATE 0.05 MG/ML IJ SOLN
INTRAMUSCULAR | Status: AC
  Filled 2014-07-31: qty 5

## 2014-07-31 MED ORDER — PHENOL 1.4 % MT LIQD
1.0000 | OROMUCOSAL | Status: DC | PRN
  Filled 2014-07-31: qty 177

## 2014-07-31 MED ORDER — FENTANYL CITRATE 0.05 MG/ML IJ SOLN
25.0000 ug | INTRAMUSCULAR | Status: DC | PRN
  Administered 2014-07-31: 50 ug via INTRAVENOUS

## 2014-07-31 MED ORDER — HYDROMORPHONE HCL 2 MG PO TABS
2.0000 mg | ORAL_TABLET | ORAL | Status: DC | PRN
Start: 2014-07-31 — End: 2014-08-02
  Administered 2014-07-31 – 2014-08-02 (×11): 2 mg via ORAL
  Filled 2014-07-31 (×11): qty 1

## 2014-07-31 MED ORDER — LIDOCAINE HCL (CARDIAC) 20 MG/ML IV SOLN
INTRAVENOUS | Status: AC
  Filled 2014-07-31: qty 5

## 2014-07-31 MED ORDER — DEXAMETHASONE SODIUM PHOSPHATE 10 MG/ML IJ SOLN
INTRAMUSCULAR | Status: DC | PRN
  Administered 2014-07-31: 10 mg via INTRAVENOUS

## 2014-07-31 MED ORDER — BISACODYL 5 MG PO TBEC: 5.0000 mg | DELAYED_RELEASE_TABLET | Freq: Every day | ORAL | Status: DC | PRN

## 2014-07-31 MED ORDER — LABETALOL HCL 5 MG/ML IV SOLN
5.0000 mg | INTRAVENOUS | Status: DC | PRN
  Administered 2014-07-31 (×2): 5 mg via INTRAVENOUS

## 2014-07-31 MED ORDER — ALUM & MAG HYDROXIDE-SIMETH 200-200-20 MG/5ML PO SUSP: 30.0000 mL | ORAL | Status: DC | PRN

## 2014-07-31 MED ORDER — SODIUM CHLORIDE 0.9 % IR SOLN
Status: DC | PRN
  Administered 2014-07-31: 3000 mL

## 2014-07-31 MED ORDER — CEFAZOLIN SODIUM-DEXTROSE 2-3 GM-% IV SOLR
INTRAVENOUS | Status: AC
  Filled 2014-07-31: qty 50

## 2014-07-31 MED ORDER — METHOCARBAMOL 500 MG PO TABS
500.0000 mg | ORAL_TABLET | Freq: Four times a day (QID) | ORAL | Status: DC | PRN
  Administered 2014-08-01 – 2014-08-02 (×3): 500 mg via ORAL
  Filled 2014-07-31 (×4): qty 1

## 2014-07-31 MED ORDER — SIMVASTATIN 20 MG PO TABS
20.0000 mg | ORAL_TABLET | Freq: Every evening | ORAL | Status: DC
  Administered 2014-07-31 – 2014-08-01 (×2): 20 mg via ORAL
  Filled 2014-07-31 (×3): qty 1

## 2014-07-31 MED ORDER — EPHEDRINE SULFATE 50 MG/ML IJ SOLN
INTRAMUSCULAR | Status: AC
  Filled 2014-07-31: qty 1

## 2014-07-31 MED ORDER — ROCURONIUM BROMIDE 100 MG/10ML IV SOLN
INTRAVENOUS | Status: AC
  Filled 2014-07-31: qty 1

## 2014-07-31 MED ORDER — METFORMIN HCL 500 MG PO TABS
1000.0000 mg | ORAL_TABLET | Freq: Two times a day (BID) | ORAL | Status: DC
  Administered 2014-08-01 – 2014-08-02 (×3): 1000 mg via ORAL
  Filled 2014-07-31 (×5): qty 2

## 2014-07-31 MED ORDER — LACTATED RINGERS IV SOLN
INTRAVENOUS | Status: DC
  Administered 2014-07-31 (×3): via INTRAVENOUS

## 2014-07-31 MED ORDER — GLIMEPIRIDE 1 MG PO TABS
1.0000 mg | ORAL_TABLET | Freq: Every morning | ORAL | Status: DC
  Filled 2014-07-31 (×2): qty 1

## 2014-07-31 MED ORDER — CHLORHEXIDINE GLUCONATE 4 % EX LIQD: 60.0000 mL | Freq: Once | CUTANEOUS | Status: DC

## 2014-07-31 MED ORDER — SODIUM CHLORIDE 0.9 % IJ SOLN
INTRAMUSCULAR | Status: AC
  Filled 2014-07-31: qty 10

## 2014-07-31 MED ORDER — PROMETHAZINE HCL 25 MG/ML IJ SOLN: 6.2500 mg | INTRAMUSCULAR | Status: DC | PRN

## 2014-07-31 MED ORDER — ONDANSETRON HCL 4 MG PO TABS: 4.0000 mg | ORAL_TABLET | Freq: Four times a day (QID) | ORAL | Status: DC | PRN

## 2014-07-31 MED ORDER — LACTATED RINGERS IV SOLN
INTRAVENOUS | Status: DC
  Administered 2014-07-31 – 2014-08-01 (×2): via INTRAVENOUS

## 2014-07-31 MED ORDER — FERROUS SULFATE 325 (65 FE) MG PO TABS: 325.0000 mg | ORAL_TABLET | Freq: Three times a day (TID) | ORAL | Status: DC

## 2014-07-31 MED ORDER — ACETAMINOPHEN 650 MG RE SUPP: 650.0000 mg | Freq: Four times a day (QID) | RECTAL | Status: DC | PRN

## 2014-07-31 MED ORDER — CYCLOBENZAPRINE HCL 5 MG PO TABS
5.0000 mg | ORAL_TABLET | Freq: Three times a day (TID) | ORAL | Status: DC | PRN
  Administered 2014-07-31 – 2014-08-01 (×2): 5 mg via ORAL
  Filled 2014-07-31 (×2): qty 1

## 2014-07-31 MED ORDER — NEOSTIGMINE METHYLSULFATE 10 MG/10ML IV SOLN
INTRAVENOUS | Status: AC
  Filled 2014-07-31: qty 1

## 2014-07-31 MED ORDER — HYDROMORPHONE HCL 1 MG/ML IJ SOLN
1.0000 mg | INTRAMUSCULAR | Status: DC | PRN
  Administered 2014-07-31 – 2014-08-01 (×7): 1 mg via INTRAVENOUS
  Filled 2014-07-31 (×7): qty 1

## 2014-07-31 MED ORDER — CEFAZOLIN SODIUM 1-5 GM-% IV SOLN
1.0000 g | Freq: Four times a day (QID) | INTRAVENOUS | Status: AC
  Administered 2014-07-31 (×2): 1 g via INTRAVENOUS
  Filled 2014-07-31 (×2): qty 50

## 2014-07-31 MED ORDER — SUCCINYLCHOLINE CHLORIDE 20 MG/ML IJ SOLN
INTRAMUSCULAR | Status: DC | PRN
  Administered 2014-07-31: 100 mg via INTRAVENOUS

## 2014-07-31 MED ORDER — THROMBIN 5000 UNITS EX SOLR
CUTANEOUS | Status: DC | PRN
  Administered 2014-07-31: 5000 [IU] via TOPICAL

## 2014-07-31 MED ORDER — THROMBIN 5000 UNITS EX SOLR
CUTANEOUS | Status: AC
  Filled 2014-07-31: qty 5000

## 2014-07-31 MED ORDER — MEPERIDINE HCL 50 MG/ML IJ SOLN: 6.2500 mg | INTRAMUSCULAR | Status: DC | PRN

## 2014-07-31 MED ORDER — FLEET ENEMA 7-19 GM/118ML RE ENEM: 1.0000 | ENEMA | Freq: Once | RECTAL | Status: AC | PRN

## 2014-07-31 MED ORDER — LIDOCAINE HCL (CARDIAC) 20 MG/ML IV SOLN
INTRAVENOUS | Status: DC | PRN
  Administered 2014-07-31: 100 mg via INTRAVENOUS

## 2014-07-31 MED ORDER — ONDANSETRON HCL 4 MG/2ML IJ SOLN
INTRAMUSCULAR | Status: AC
  Filled 2014-07-31: qty 2

## 2014-07-31 MED ORDER — LABETALOL HCL 5 MG/ML IV SOLN
INTRAVENOUS | Status: AC
  Filled 2014-07-31: qty 4

## 2014-07-31 MED ORDER — PROPOFOL 10 MG/ML IV BOLUS
INTRAVENOUS | Status: AC
  Filled 2014-07-31: qty 20

## 2014-07-31 MED ORDER — POLYMYXIN B SULFATE 500000 UNITS IJ SOLR
INTRAMUSCULAR | Status: AC
  Filled 2014-07-31: qty 1

## 2014-07-31 MED ORDER — ROCURONIUM BROMIDE 100 MG/10ML IV SOLN
INTRAVENOUS | Status: DC | PRN
  Administered 2014-07-31: 30 mg via INTRAVENOUS

## 2014-07-31 MED ORDER — ONDANSETRON HCL 4 MG/2ML IJ SOLN
INTRAMUSCULAR | Status: DC | PRN
  Administered 2014-07-31: 4 mg via INTRAVENOUS

## 2014-07-31 MED ORDER — FENTANYL CITRATE 0.05 MG/ML IJ SOLN
INTRAMUSCULAR | Status: DC | PRN
  Administered 2014-07-31: 50 ug via INTRAVENOUS
  Administered 2014-07-31: 100 ug via INTRAVENOUS
  Administered 2014-07-31 (×2): 50 ug via INTRAVENOUS
  Administered 2014-07-31: 100 ug via INTRAVENOUS

## 2014-07-31 MED ORDER — GLYCOPYRROLATE 0.2 MG/ML IJ SOLN
INTRAMUSCULAR | Status: AC
  Filled 2014-07-31: qty 3

## 2014-07-31 MED ORDER — GLYCOPYRROLATE 0.2 MG/ML IJ SOLN
INTRAMUSCULAR | Status: DC | PRN
  Administered 2014-07-31: 0.6 mg via INTRAVENOUS

## 2014-07-31 MED ORDER — RIVAROXABAN 10 MG PO TABS
10.0000 mg | ORAL_TABLET | Freq: Every day | ORAL | Status: DC
  Administered 2014-08-01 – 2014-08-02 (×2): 10 mg via ORAL
  Filled 2014-07-31 (×3): qty 1

## 2014-07-31 MED ORDER — NEOSTIGMINE METHYLSULFATE 10 MG/10ML IV SOLN
INTRAVENOUS | Status: DC | PRN
  Administered 2014-07-31: 5 mg via INTRAVENOUS

## 2014-07-31 MED ORDER — INSULIN ASPART 100 UNIT/ML ~~LOC~~ SOLN
0.0000 [IU] | Freq: Three times a day (TID) | SUBCUTANEOUS | Status: DC
  Administered 2014-07-31: 3 [IU] via SUBCUTANEOUS
  Administered 2014-08-01 (×2): 2 [IU] via SUBCUTANEOUS
  Administered 2014-08-01: 3 [IU] via SUBCUTANEOUS
  Administered 2014-08-02: 2 [IU] via SUBCUTANEOUS

## 2014-07-31 MED ORDER — ONDANSETRON HCL 4 MG/2ML IJ SOLN: 4.0000 mg | Freq: Four times a day (QID) | INTRAMUSCULAR | Status: DC | PRN

## 2014-07-31 MED ORDER — CELECOXIB 200 MG PO CAPS
200.0000 mg | ORAL_CAPSULE | Freq: Two times a day (BID) | ORAL | Status: DC
  Administered 2014-07-31 – 2014-08-02 (×4): 200 mg via ORAL
  Filled 2014-07-31 (×5): qty 1

## 2014-07-31 MED ORDER — MIDAZOLAM HCL 2 MG/2ML IJ SOLN
INTRAMUSCULAR | Status: AC
  Filled 2014-07-31: qty 2

## 2014-07-31 MED ORDER — POLYETHYLENE GLYCOL 3350 17 G PO PACK: 17.0000 g | PACK | Freq: Every day | ORAL | Status: DC | PRN

## 2014-07-31 MED ORDER — LOSARTAN POTASSIUM 50 MG PO TABS
100.0000 mg | ORAL_TABLET | Freq: Every morning | ORAL | Status: DC
  Administered 2014-07-31 – 2014-08-02 (×3): 100 mg via ORAL
  Filled 2014-07-31 (×3): qty 2

## 2014-07-31 MED ORDER — SODIUM CHLORIDE 0.9 % IJ SOLN
INTRAMUSCULAR | Status: DC | PRN
  Administered 2014-07-31: 20 mL

## 2014-07-31 MED ORDER — HYDROMORPHONE HCL 1 MG/ML IJ SOLN
INTRAMUSCULAR | Status: DC | PRN
  Administered 2014-07-31 (×2): 1 mg via INTRAVENOUS

## 2014-07-31 MED ORDER — METHOCARBAMOL 1000 MG/10ML IJ SOLN
500.0000 mg | Freq: Four times a day (QID) | INTRAMUSCULAR | Status: DC | PRN
  Administered 2014-07-31: 500 mg via INTRAVENOUS
  Filled 2014-07-31: qty 5

## 2014-07-31 MED ORDER — PROPOFOL 10 MG/ML IV BOLUS
INTRAVENOUS | Status: DC | PRN
  Administered 2014-07-31: 200 mg via INTRAVENOUS

## 2014-07-31 MED ORDER — BUPIVACAINE LIPOSOME 1.3 % IJ SUSP
20.0000 mL | Freq: Once | INTRAMUSCULAR | Status: AC
  Administered 2014-07-31: 20 mL
  Filled 2014-07-31: qty 20

## 2014-07-31 MED ORDER — MENTHOL 3 MG MT LOZG
1.0000 | LOZENGE | OROMUCOSAL | Status: DC | PRN
  Filled 2014-07-31: qty 9

## 2014-07-31 MED ORDER — DEXAMETHASONE SODIUM PHOSPHATE 10 MG/ML IJ SOLN
INTRAMUSCULAR | Status: AC
  Filled 2014-07-31: qty 1

## 2014-07-31 MED ORDER — ACETAMINOPHEN 325 MG PO TABS: 650.0000 mg | ORAL_TABLET | Freq: Four times a day (QID) | ORAL | Status: DC | PRN

## 2014-07-31 MED ORDER — FERROUS SULFATE 325 (65 FE) MG PO TABS
325.0000 mg | ORAL_TABLET | Freq: Three times a day (TID) | ORAL | Status: DC
  Administered 2014-08-01 – 2014-08-02 (×4): 325 mg via ORAL
  Filled 2014-07-31 (×7): qty 1

## 2014-07-31 MED ORDER — STERILE WATER FOR IRRIGATION IR SOLN
Status: DC | PRN
  Administered 2014-07-31: 1500 mL

## 2014-07-31 MED ORDER — CEFAZOLIN SODIUM-DEXTROSE 2-3 GM-% IV SOLR
2.0000 g | INTRAVENOUS | Status: AC
  Administered 2014-07-31: 2 g via INTRAVENOUS

## 2014-07-31 MED ORDER — HYDROMORPHONE HCL 2 MG/ML IJ SOLN
INTRAMUSCULAR | Status: AC
  Filled 2014-07-31: qty 1

## 2014-07-31 MED ORDER — TRANEXAMIC ACID 100 MG/ML IV SOLN
1000.0000 mg | INTRAVENOUS | Status: AC
  Administered 2014-07-31: 1000 mg via INTRAVENOUS
  Filled 2014-07-31: qty 10

## 2014-07-31 MED ORDER — LABETALOL HCL 5 MG/ML IV SOLN
INTRAVENOUS | Status: DC | PRN
  Administered 2014-07-31: 5 mg via INTRAVENOUS

## 2014-07-31 MED ORDER — MIDAZOLAM HCL 5 MG/5ML IJ SOLN
INTRAMUSCULAR | Status: DC | PRN
  Administered 2014-07-31: 2 mg via INTRAVENOUS

## 2014-07-31 MED ORDER — SODIUM CHLORIDE 0.9 % IR SOLN
Status: DC | PRN
  Administered 2014-07-31: 08:00:00

## 2014-07-31 SURGICAL SUPPLY — 76 items
ADH SKN CLS APL DERMABOND .7 (GAUZE/BANDAGES/DRESSINGS) ×1
BAG SPEC THK2 15X12 ZIP CLS (MISCELLANEOUS)
BAG ZIPLOCK 12X15 (MISCELLANEOUS) IMPLANT
BANDAGE ELASTIC 4 VELCRO ST LF (GAUZE/BANDAGES/DRESSINGS) ×3 IMPLANT
BANDAGE ELASTIC 6 VELCRO ST LF (GAUZE/BANDAGES/DRESSINGS) ×3 IMPLANT
BANDAGE ESMARK 6X9 LF (GAUZE/BANDAGES/DRESSINGS) ×1 IMPLANT
BLADE SAG 18X100X1.27 (BLADE) ×3 IMPLANT
BLADE SAW SGTL 11.0X1.19X90.0M (BLADE) ×3 IMPLANT
BNDG CMPR 9X6 STRL LF SNTH (GAUZE/BANDAGES/DRESSINGS) ×1
BNDG ESMARK 6X9 LF (GAUZE/BANDAGES/DRESSINGS) ×3
BONE CEMENT GENTAMICIN (Cement) ×6 IMPLANT
CAPT RP KNEE ×2 IMPLANT
CATH FOLEY LATEX FREE 16FR (CATHETERS) ×2 IMPLANT
CEMENT BONE GENTAMICIN 40 (Cement) ×2 IMPLANT
CLOSURE WOUND 1/2 X4 (GAUZE/BANDAGES/DRESSINGS) ×1
CUFF TOURN SGL QUICK 34 (TOURNIQUET CUFF) ×3
CUFF TRNQT CYL 34X4X40X1 (TOURNIQUET CUFF) ×1 IMPLANT
DERMABOND ADVANCED (GAUZE/BANDAGES/DRESSINGS) ×2
DERMABOND ADVANCED .7 DNX12 (GAUZE/BANDAGES/DRESSINGS) ×1 IMPLANT
DRAPE EXTREMITY TIBURON (DRAPES) ×3 IMPLANT
DRAPE INCISE IOBAN 66X45 STRL (DRAPES) IMPLANT
DRAPE POUCH INSTRU U-SHP 10X18 (DRAPES) ×3 IMPLANT
DRAPE U-SHAPE 47X51 STRL (DRAPES) ×3 IMPLANT
DRSG ADAPTIC 3X8 NADH LF (GAUZE/BANDAGES/DRESSINGS) ×2 IMPLANT
DRSG AQUACEL AG ADV 3.5X10 (GAUZE/BANDAGES/DRESSINGS) ×1 IMPLANT
DRSG KUZMA FLUFF (GAUZE/BANDAGES/DRESSINGS) ×2 IMPLANT
DRSG PAD ABDOMINAL 8X10 ST (GAUZE/BANDAGES/DRESSINGS) IMPLANT
DRSG TEGADERM 4X4.75 (GAUZE/BANDAGES/DRESSINGS) ×3 IMPLANT
DURAPREP 26ML APPLICATOR (WOUND CARE) ×3 IMPLANT
ELECT REM PT RETURN 9FT ADLT (ELECTROSURGICAL) ×3
ELECTRODE REM PT RTRN 9FT ADLT (ELECTROSURGICAL) ×1 IMPLANT
EVACUATOR 1/8 PVC DRAIN (DRAIN) ×3 IMPLANT
FACESHIELD WRAPAROUND (MASK) ×15 IMPLANT
FACESHIELD WRAPAROUND OR TEAM (MASK) ×5 IMPLANT
GAUZE SPONGE 2X2 8PLY STRL LF (GAUZE/BANDAGES/DRESSINGS) ×1 IMPLANT
GAUZE SPONGE 4X4 12PLY STRL (GAUZE/BANDAGES/DRESSINGS) ×2 IMPLANT
GLOVE BIOGEL PI IND STRL 6.5 (GLOVE) ×1 IMPLANT
GLOVE BIOGEL PI IND STRL 8 (GLOVE) ×1 IMPLANT
GLOVE BIOGEL PI INDICATOR 6.5 (GLOVE) ×2
GLOVE BIOGEL PI INDICATOR 8 (GLOVE) ×2
GLOVE ECLIPSE 8.0 STRL XLNG CF (GLOVE) ×6 IMPLANT
GLOVE SURG SS PI 6.5 STRL IVOR (GLOVE) ×3 IMPLANT
GOWN STRL REUS W/TWL LRG LVL3 (GOWN DISPOSABLE) ×3 IMPLANT
GOWN STRL REUS W/TWL XL LVL3 (GOWN DISPOSABLE) ×3 IMPLANT
HANDPIECE INTERPULSE COAX TIP (DISPOSABLE) ×3
IMMOBILIZER KNEE 20 (SOFTGOODS) ×3
IMMOBILIZER KNEE 20 THIGH 36 (SOFTGOODS) ×1 IMPLANT
KIT BASIN OR (CUSTOM PROCEDURE TRAY) ×3 IMPLANT
MANIFOLD NEPTUNE II (INSTRUMENTS) ×3 IMPLANT
NEEDLE HYPO 22GX1.5 SAFETY (NEEDLE) ×3 IMPLANT
NS IRRIG 1000ML POUR BTL (IV SOLUTION) IMPLANT
PACK TOTAL JOINT (CUSTOM PROCEDURE TRAY) ×3 IMPLANT
PADDING CAST COTTON 6X4 STRL (CAST SUPPLIES) IMPLANT
POSITIONER SURGICAL ARM (MISCELLANEOUS) ×3 IMPLANT
SET HNDPC FAN SPRY TIP SCT (DISPOSABLE) ×1 IMPLANT
SET PAD KNEE POSITIONER (MISCELLANEOUS) ×3 IMPLANT
SPONGE GAUZE 2X2 STER 10/PKG (GAUZE/BANDAGES/DRESSINGS) ×2
SPONGE LAP 18X18 X RAY DECT (DISPOSABLE) IMPLANT
SPONGE SURGIFOAM ABS GEL 100 (HEMOSTASIS) ×3 IMPLANT
STAPLER VISISTAT 35W (STAPLE) IMPLANT
STRIP CLOSURE SKIN 1/2X4 (GAUZE/BANDAGES/DRESSINGS) ×1 IMPLANT
SUCTION FRAZIER 12FR DISP (SUCTIONS) ×3 IMPLANT
SUT BONE WAX W31G (SUTURE) ×3 IMPLANT
SUT MNCRL AB 4-0 PS2 18 (SUTURE) ×3 IMPLANT
SUT VIC AB 1 CT1 27 (SUTURE) ×6
SUT VIC AB 1 CT1 27XBRD ANTBC (SUTURE) ×2 IMPLANT
SUT VIC AB 2-0 CT1 27 (SUTURE) ×9
SUT VIC AB 2-0 CT1 TAPERPNT 27 (SUTURE) ×3 IMPLANT
SUT VLOC 180 0 24IN GS25 (SUTURE) ×3 IMPLANT
SYR 20CC LL (SYRINGE) ×5 IMPLANT
TOWEL OR 17X26 10 PK STRL BLUE (TOWEL DISPOSABLE) ×3 IMPLANT
TOWEL OR NON WOVEN STRL DISP B (DISPOSABLE) IMPLANT
TOWER CARTRIDGE SMART MIX (DISPOSABLE) ×3 IMPLANT
TRAY FOLEY CATH 14FRSI W/METER (CATHETERS) ×3 IMPLANT
WATER STERILE IRR 1500ML POUR (IV SOLUTION) ×3 IMPLANT
WRAP KNEE MAXI GEL POST OP (GAUZE/BANDAGES/DRESSINGS) ×3 IMPLANT

## 2014-07-31 NOTE — Progress Notes (Signed)
PACU note----pt's blood pressure elevated, Dr. Gentry RochJudd in to check pt, order rec'd and med given

## 2014-07-31 NOTE — Anesthesia Postprocedure Evaluation (Signed)
  Anesthesia Post-op Note  Patient: [name] Floor  Procedure(s) Performed: Procedure(s) (LRB): TOTAL LEFT KNEE ARTHROPLASTY (Left)  Patient Location: PACU  Anesthesia Type: General  Level of Consciousness: awake and alert   Airway and Oxygen Therapy: Patient Spontanous Breathing  Post-op Pain: mild  Post-op Assessment: Post-op Vital signs reviewed, Patient's Cardiovascular Status Stable, Respiratory Function Stable, Patent Airway and No signs of Nausea or vomiting  Last Vitals:  Filed Vitals:   07/31/14 0529  BP: 162/89  Pulse: 83  Temp: 36.4 C  Resp: 16    Post-op Vital Signs: stable   Complications: No apparent anesthesia complications

## 2014-07-31 NOTE — Transfer of Care (Signed)
Immediate Anesthesia Transfer of Care Note  Patient: [name]  Procedure(s) Performed: Procedure(s) (LRB): TOTAL LEFT KNEE ARTHROPLASTY (Left)  Patient Location: PACU  Anesthesia Type: General  Level of Consciousness: sedated, patient cooperative and responds to stimulation  Airway & Oxygen Therapy: Patient Spontanous Breathing and Patient connected to face mask oxgen  Post-op Assessment: Report given to PACU RN and Post -op Vital signs reviewed and stable  Post vital signs: Reviewed and stable  Complications: No apparent anesthesia complications

## 2014-07-31 NOTE — Progress Notes (Signed)
PACU note----Amber, PA with Dr. Darrelyn HillockGioffre states not to discontinue pt's foley cath until he is more more alert and awake either upon discharge from PACU or on arrival to his hospital room

## 2014-07-31 NOTE — Anesthesia Preprocedure Evaluation (Addendum)
Anesthesia Evaluation  Patient identified by MRN, date of birth, ID band Patient awake    Reviewed: Allergy & Precautions, H&P , NPO status , Patient's Chart, lab work & pertinent test results  History of Anesthesia Complications Negative for: history of anesthetic complications  Airway Mallampati: III TM Distance: >3 FB Neck ROM: Full    Dental no notable dental hx.    Pulmonary former smoker,  breath sounds clear to auscultation  Pulmonary exam normal       Cardiovascular Exercise Tolerance: Good hypertension, Pt. on medications Rhythm:Regular Rate:Normal     Neuro/Psych negative neurological ROS  negative psych ROS   GI/Hepatic negative GI ROS, Neg liver ROS,   Endo/Other  diabetes, Well Controlled, Type 2, Oral Hypoglycemic Agents  Renal/GU negative Renal ROS  negative genitourinary   Musculoskeletal  (+) Arthritis -, Osteoarthritis,    Abdominal   Peds negative pediatric ROS (+)  Hematology  (+) anemia ,   Anesthesia Other Findings   Reproductive/Obstetrics negative OB ROS                          Anesthesia Physical Anesthesia Plan  ASA: II  Anesthesia Plan: General   Post-op Pain Management:    Induction: Intravenous  Airway Management Planned: Oral ETT  Additional Equipment:   Intra-op Plan:   Post-operative Plan: Extubation in OR  Informed Consent: I have reviewed the patients History and Physical, chart, labs and discussed the procedure including the risks, benefits and alternatives for the proposed anesthesia with the patient or authorized representative who has indicated his/her understanding and acceptance.   Dental advisory given  Plan Discussed with: CRNA  Anesthesia Plan Comments:         Anesthesia Quick Evaluation

## 2014-07-31 NOTE — Interval H&P Note (Signed)
History and Physical Interval Note:  07/31/2014 7:21 AM  [name]  has presented today for surgery, with the diagnosis of LEFT KNEE OA  The various methods of treatment have been discussed with the patient and family. After consideration of risks, benefits and other options for treatment, the patient has consented to  Procedure(s): TOTAL LEFT KNEE ARTHROPLASTY (Left) as a surgical intervention .  The patient's history has been reviewed, patient examined, no change in status, stable for surgery.  I have reviewed the patient's chart and labs.  Questions were answered to the patient's satisfaction.     [name]

## 2014-07-31 NOTE — Progress Notes (Signed)
Utilization review completed.  

## 2014-07-31 NOTE — Evaluation (Signed)
Physical Therapy Evaluation Patient Details Name: [name] MRN: [id number] DOB: May 16, 1941 Today's Date: 07/31/2014   History of Present Illness  L TKR - s/p R TKR 10/14  Clinical Impression  Pt s/p L TKR presents with decreased L LE strength/ROM and post op pain limiting functional mobility.  Pt should progress well to d/c home with family assist and HHPT follow up.    Follow Up Recommendations Home health PT    Equipment Recommendations  None recommended by PT    Recommendations for Other Services OT consult     Precautions / Restrictions Precautions Precautions: Knee;Fall Required Braces or Orthoses: Knee Immobilizer - Left Knee Immobilizer - Left: Discontinue once straight leg raise with < 10 degree lag Restrictions Weight Bearing Restrictions: No Other Position/Activity Restrictions: WBAT      Mobility  Bed Mobility Overal bed mobility: Needs Assistance Bed Mobility: Supine to Sit     Supine to sit: Min assist     General bed mobility comments: cues for sequence and use of R LE to self assist  Transfers Overall transfer level: Needs assistance Equipment used: Rolling walker (2 wheeled) Transfers: Sit to/from Stand Sit to Stand: Min assist;Mod assist Stand pivot transfers: Min assist       General transfer comment: cues for LE management and use of UEs to self assist.  Stand pvt wtih RW bed to [facility]  Ambulation/Gait Ambulation/Gait assistance: Min assist Ambulation Distance (Feet): 63 Feet Assistive device: Rolling walker (2 wheeled) Gait Pattern/deviations: Step-to pattern;Decreased step length - right;Decreased step length - left;Shuffle;Trunk flexed     General Gait Details: cues for sequence, posture and position from [employer]            Wheelchair Mobility    Modified Rankin (Stroke Patients Only)       Balance                                             Pertinent Vitals/Pain Pain Assessment: 0-10 Pain  Score: 5  Pain Location: L knee Pain Descriptors / Indicators: Aching;Sore Pain Intervention(s): Limited activity within patient's tolerance;Monitored during session;Premedicated before session;RN gave pain meds during session;Ice applied    Home Living Family/patient expects to be discharged to:: Private residence Living Arrangements: Spouse/significant other Available Help at Discharge: Family Type of Home: House Home Access: Level entry     Home Layout: One level Home Equipment: Environmental consultant - 2 wheels;Cane - single point      Prior Function Level of Independence: Independent;Independent with assistive device(s)         Comments: used cane as needed     Hand Dominance        Extremity/Trunk Assessment   Upper Extremity Assessment: Overall WFL for tasks assessed           Lower Extremity Assessment: LLE deficits/detail   LLE Deficits / Details: 2/5 quads  Cervical / Trunk Assessment: Normal  Communication   Communication: No difficulties  Cognition Arousal/Alertness: Awake/alert Behavior During Therapy: WFL for tasks assessed/performed Overall Cognitive Status: Within Functional Limits for tasks assessed                      General Comments      Exercises        Assessment/Plan    PT Assessment Patient needs continued PT services  PT Diagnosis Difficulty walking  PT Problem List Decreased strength;Decreased range of motion;Decreased activity tolerance;Decreased mobility;Decreased knowledge of use of DME;Pain  PT Treatment Interventions DME instruction;Gait training;Stair training;Functional mobility training;Therapeutic activities;Therapeutic exercise;Patient/family education   PT Goals (Current goals can be found in the Care Plan section) Acute Rehab PT Goals Patient Stated Goal: Resume previous lifestyle with decreased pain PT Goal Formulation: With patient Time For Goal Achievement: 08/07/14 Potential to Achieve Goals: Good     Frequency 7X/week   Barriers to discharge        Co-evaluation               End of Session Equipment Utilized During Treatment: Left knee immobilizer;Gait belt Activity Tolerance: Patient tolerated treatment well Patient left: in chair;with call bell/phone within reach Nurse Communication: Mobility status         Time: 9604-54091625-1658 PT Time Calculation (min): 33 min   Charges:   PT Evaluation $Initial PT Evaluation Tier I: 1 Procedure PT Treatments $Gait Training: 8-22 mins $Therapeutic Activity: 8-22 mins   PT G Codes:          [name] 07/31/2014, 6:20 PM

## 2014-07-31 NOTE — Op Note (Signed)
[name]:  [name], [name]               ACCOUNT NO.:  [id number]  MEDICAL RECORD NO.:  [id number]  LOCATION:  [facility]                         [facility]  PHYSICIAN:  Georges Lynchonald A. Torrell Krutz, M.D.DATE OF BIRTH:  Jun 19, 1941  DATE OF PROCEDURE: DATE OF DISCHARGE:                              OPERATIVE REPORT   SURGEON:  Megean Fabio A. Tinslee Klare, M.D.  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  OPERATION:  Left total knee arthroplasty utilizing DePuy system.  All 3 components were cemented.  Gentamicin was used in the cement.  The patella was a size 38 with 3 pegs.  The femoral component was a size 4 narrow with a posterior cruciate sacrificing type.  The tibial tray was a size 4.  The insert was a size 4, 12.5 mm thick rotating platform.  DESCRIPTION OF PROCEDURE:  Under general anesthesia, routine orthopedic prepping and draping of the left lower extremity was carried out. Appropriate time-out was first carried out.  I also marked the appropriate left leg in the holding area.  At this time, the patient had 2 g of IV Ancef.  The leg was exsanguinated and Esmarch tourniquet was elevated at 325 mmHg.  The knee was placed in a [facility] knee holder. Knee was flexed and anterior approach of the knee was carried out.  Two flaps were created.  I then carried out a median parapatellar approach reflected patella laterally.  Then, there was knee flexed.  I did medial and lateral meniscectomies.  I excised the anterior posterior cruciate ligaments.  At this time, initial drill hole was made in the intercondylar notch region in usual fashion.  The guide rod was inserted up to the femoral canal to make sure we are in the canal.  We then removed 13 mm thickness off the distal femur in the usual fashion.  We then  measured our femur to be a size 4.  We cut the femur for a size 4 and elected to use a size 4 narrow component.  After this was done, we then directed attention to the tibial plateau.  We measured the tibial plateau to be a size 4.  We then made our initial drill hole in the tibial plateau in the usual fashion.  The guide rod was inserted by use of the intramedullary guide rod, we removed 6 mm thickness off the affected medial side.  Following that, we irrigated out the knee.  After that cut was made, we then inserted our lamina spreaders en route posterior spurs off the condyles.  We then removed the lamina spreaders irrigated the knee and then continued to do keel cut out of the tibial plateau.  We also did the femoral notch cut.  We inserted our temporary trial prostheses.  We had an excellent fit with a 12.5 mm thickness rotating platform.  We then did a resurfacing procedure on the patella in the usual fashion.  We measured the patella to be a size 38.  Three drill holes were made in the articular surface of the patella.  All trial components were removed.  We thoroughly water picked out the knee dried the knee out cemented all 3 components in simultaneously.  All loose pieces of cement were removed.  Following that, we then removed the trial tibial insert irrigated out the knee, made sure there were no other loose pieces cement present and then we inserted some thrombin- soaked Gelfoam posteriorly.  I then inserted my permanent rotating platform 12.5 mm thickness size 4 reduced the knee and had excellent function and excellent extension and flexion.  We also had good medial and lateral stability.  We then inserted a Hemovac drain closed the wound layers in usual fashion.  Sterile dressings were applied.  Note, the patient as I mentioned had 2 g of IV Ancef preop.          ______________________________ Georges Lynch. Darrelyn Hillock, M.D.     RAG/MEDQ  D:  07/31/2014  T:  07/31/2014  Job:   [id number]

## 2014-07-31 NOTE — Brief Op Note (Signed)
07/31/2014  9:21 AM  PATIENT:  [name]  73 y.o. male  PRE-OPERATIVE DIAGNOSIS:  LEFT KNEE OA  POST-OPERATIVE DIAGNOSIS:  LEFT KNEE OA  PROCEDURE:  Procedure(s): TOTAL LEFT KNEE ARTHROPLASTY (Left)  SURGEON:  Surgeon(s) and Role:    * Jacki Conesonald A Izabellah Dadisman, MD - Primary  PHYSICIAN ASSISTANT: Dimitri PedAmber Constable PA  ASSISTANTS: Dimitri PedAmber Constable PA  ANESTHESIA:   general  EBL:  Total I/O In: 1000 [I.V.:1000] Out: -   BLOOD ADMINISTERED:none  DRAINS: (One) Hemovact drain(s) in the Left with  Suction Open   LOCAL MEDICATIONS USED:  BUPIVICAINE20cc mixed with20cc of Normal Saline   SPECIMEN:  No Specimen  DISPOSITION OF SPECIMEN:  N/A  COUNTS:  YES  TOURNIQUET:  * Missing tourniquet times found for documented tourniquets in log:  [id number] *  DICTATION: .Other Dictation: Dictation Number [id number]  PLAN OF CARE: Admit to inpatient   PATIENT DISPOSITION:  Stable in OR   Delay start of Pharmacological VTE agent (>24hrs) due to surgical blood loss or risk of bleeding: yes

## 2014-08-01 LAB — BASIC METABOLIC PANEL
Anion gap: 12 (ref 5–15)
BUN: 19 mg/dL (ref 6–23)
CALCIUM: 8.9 mg/dL (ref 8.4–10.5)
CHLORIDE: 97 meq/L (ref 96–112)
CO2: 27 mEq/L (ref 19–32)
CREATININE: 0.94 mg/dL (ref 0.50–1.35)
GFR calc Af Amer: >90 mL/min (ref >90)
GFR calc non Af Amer: 82 mL/min — ABNORMAL LOW (ref >90)
GLUCOSE: 168 mg/dL — AB (ref 70–99)
Potassium: 4.4 mEq/L (ref 3.7–5.3)
Sodium: 136 mEq/L — ABNORMAL LOW (ref 137–147)

## 2014-08-01 LAB — GLUCOSE, CAPILLARY
GLUCOSE-CAPILLARY: 138 mg/dL — AB (ref 70–99)
Glucose-Capillary: 154 mg/dL — ABNORMAL HIGH (ref 70–99)
Glucose-Capillary: 171 mg/dL — ABNORMAL HIGH (ref 70–99)
Glucose-Capillary: 147 mg/dL — ABNORMAL HIGH (ref 70–99)
Glucose-Capillary: 215 mg/dL — ABNORMAL HIGH (ref 70–99)

## 2014-08-01 LAB — CBC
HEMATOCRIT: 34.2 % — AB (ref 39.0–52.0)
HEMOGLOBIN: 11.3 g/dL — AB (ref 13.0–17.0)
MCH: 30.3 pg (ref 26.0–34.0)
MCHC: 33 g/dL (ref 30.0–36.0)
MCV: 91.7 fL (ref 78.0–100.0)
PLATELETS: 249 10*3/uL (ref 150–400)
RBC: 3.73 MIL/uL — AB (ref 4.22–5.81)
RDW: 13.3 % (ref 11.5–15.5)
WBC: 13.8 10*3/uL — ABNORMAL HIGH (ref 4.0–10.5)

## 2014-08-01 NOTE — Progress Notes (Signed)
Physical Therapy Treatment Patient Details Name: [name] Mini MRN: [id number] DOB: 08/13/1941 Today's Date: 08/01/2014    History of Present Illness L TKR - s/p R TKR 10/14    PT Comments    POD # 1 pm session.  Applied KI and assited pt OOB to amb greater distance.  Assisted to BR then back to bed followed by ICE.  Pt progressing well and eager to go home tomorrow.  Follow Up Recommendations  Home health PT     Equipment Recommendations  None recommended by PT    Recommendations for Other Services       Precautions / Restrictions Precautions Precautions: Knee;Fall Precaution Comments: instructed pt on KI use for amb and proper application Required Braces or Orthoses: Knee Immobilizer - Left Knee Immobilizer - Left: Discontinue once straight leg raise with < 10 degree lag Restrictions Weight Bearing Restrictions: No Other Position/Activity Restrictions: WBAT    Mobility  Bed Mobility Overal bed mobility: Needs Assistance Bed Mobility: Supine to Sit;Sit to Supine     Supine to sit: Min guard Sit to supine: Min guard   General bed mobility comments: increased time  Transfers Overall transfer level: Needs assistance Equipment used: Rolling walker (2 wheeled) Transfers: Sit to/from Stand Sit to Stand: Min assist         General transfer comment: 25% VC's on proper hand placement and increased time  Ambulation/Gait Ambulation/Gait assistance: Min assist Ambulation Distance (Feet): 65 Feet Assistive device: Rolling walker (2 wheeled) Gait Pattern/deviations: Step-to pattern;Decreased stance time - left;Trunk flexed Gait velocity: decreased   General Gait Details: cues for sequence, posture and position from [employer]   Stairs            Wheelchair Mobility    Modified Rankin (Stroke Patients Only)       Balance                                    Cognition                            Exercises      General Comments         Pertinent Vitals/Pain Pain Assessment: 0-10 Pain Score: 4  Pain Location: L knee Pain Descriptors / Indicators: Aching;Constant;Sore Pain Intervention(s): Monitored during session;Repositioned;Ice applied    Home Living                      Prior Function            PT Goals (current goals can now be found in the care plan section) Progress towards PT goals: Progressing toward goals    Frequency  7X/week    PT Plan      Co-evaluation             End of Session Equipment Utilized During Treatment: Left knee immobilizer;Gait belt Activity Tolerance: Patient tolerated treatment well Patient left: in chair;with call bell/phone within reach     Time: 1335-1400 PT Time Calculation (min): 25 min  Charges:  $Gait Training: 8-22 mins $Therapeutic Activity: 8-22 mins                    G Codes:      [name]  PTA [facility]  Acute  Rehab Pager      [phone]

## 2014-08-01 NOTE — Discharge Instructions (Addendum)
Walk with your walker. Weight bearing as tolerated Home Health Agency will follow you at home for your therapy Change your dressing daily. Shower only, no tub bath. Call if any temperatures greater than 101 or any wound complications: [phone] during the day and ask for Dr. Jeannetta EllisGioffre's nurse, Mackey Birchwoodammy Johnson.   Information on my medicine - XARELTO (Rivaroxaban)  This medication education was reviewed with me or my healthcare representative as part of my discharge preparation.  The pharmacist that spoke with me during my hospital stay was:  Maurice MarchJackson, Rachel E, [facility]  Why was Xarelto prescribed for you? Xarelto was prescribed for you to reduce the risk of blood clots forming after orthopedic surgery. The medical term for these abnormal blood clots is venous thromboembolism (VTE).  What do you need to know about xarelto ? Take your Xarelto ONCE DAILY at the same time every day. You may take it either with or without food.  If you have difficulty swallowing the tablet whole, you may crush it and mix in applesauce just prior to taking your dose.  Take Xarelto exactly as prescribed by your doctor and DO NOT stop taking Xarelto without talking to the doctor who prescribed the medication.  Stopping without other VTE prevention medication to take the place of Xarelto may increase your risk of developing a clot.  After discharge, you should have regular check-up appointments with your healthcare provider that is prescribing your Xarelto.    What do you do if you miss a dose? If you miss a dose, take it as soon as you remember on the same day then continue your regularly scheduled once daily regimen the next day. Do not take two doses of Xarelto on the same day.   Important Safety Information A possible side effect of Xarelto is bleeding. You should call your healthcare provider right away if you experience any of the following:   Bleeding from an injury or your nose that does not stop.   Unusual colored urine (red or dark brown) or unusual colored stools (red or black).   Unusual bruising for unknown reasons.   A serious fall or if you hit your head (even if there is no bleeding).  Some medicines may interact with Xarelto and might increase your risk of bleeding while on Xarelto. To help avoid this, consult your healthcare provider or pharmacist prior to using any new prescription or non-prescription medications, including herbals, vitamins, non-steroidal anti-inflammatory drugs (NSAIDs) and supplements.  This website has more information on Xarelto: [email or web address].xarelto.[email or web address].

## 2014-08-01 NOTE — Progress Notes (Signed)
OT Cancellation Note  Patient Details Name: Karrie MeresDesmond Rossy MRN: [id number] DOB: February 10, 1941   Cancelled Treatment:    Reason Eval/Treat Not Completed: OT screened, no needs identified, will sign off  Jamilex Bohnsack, Metro KungLorraine D 08/01/2014, 11:39 AM

## 2014-08-01 NOTE — Progress Notes (Signed)
Physical Therapy Treatment Patient Details Name: William MeresDesmond Vellucci MRN: [id number] DOB: [birth year] Today's Date: 08/01/2014    History of Present Illness L TKR - s/p R TKR 10/14    PT Comments    POD # 1 am session.  Applied KI and educated pt on use for amb.  Assisted OOB to amb limited distance then returned to room to perform TKR TE's followed bu ICE.  Follow Up Recommendations  Home health PT     Equipment Recommendations  None recommended by PT    Recommendations for Other Services       Precautions / Restrictions Precautions Precautions: Knee;Fall Precaution Comments: instructed pt on KI use for amb and proper application Required Braces or Orthoses: Knee Immobilizer - Left Knee Immobilizer - Left: Discontinue once straight leg raise with < 10 degree lag Restrictions Weight Bearing Restrictions: No Other Position/Activity Restrictions: WBAT    Mobility  Bed Mobility Overal bed mobility: Needs Assistance Bed Mobility: Supine to Sit     Supine to sit: Min guard     General bed mobility comments: increased time  Transfers Overall transfer level: Needs assistance Equipment used: Rolling walker (2 wheeled) Transfers: Sit to/from Stand Sit to Stand: Min assist         General transfer comment: 25% VC's on proper hand placement and increased time  Ambulation/Gait Ambulation/Gait assistance: Min assist Ambulation Distance (Feet): 46 Feet Assistive device: Rolling walker (2 wheeled) Gait Pattern/deviations: Step-to pattern;Decreased stance time - left;Trunk flexed Gait velocity: decreased   General Gait Details: cues for sequence, posture and position from [employer]   Stairs            Wheelchair Mobility    Modified Rankin (Stroke Patients Only)       Balance                                    Cognition                            Exercises   Total Knee Replacement TE's 10 reps B LE ankle pumps 10 reps towel  squeezes 10 reps knee presses 10 reps heel slides  10 reps SAQ's 10 reps SLR's 10 reps ABD Followed by ICE     General Comments        Pertinent Vitals/Pain Pain Assessment: 0-10 Pain Score: 4  Pain Location: L knee Pain Descriptors / Indicators: Aching;Constant;Sore Pain Intervention(s): Monitored during session;Repositioned;Ice applied    Home Living                      Prior Function            PT Goals (current goals can now be found in the care plan section) Progress towards PT goals: Progressing toward goals    Frequency  7X/week    PT Plan      Co-evaluation             End of Session Equipment Utilized During Treatment: Left knee immobilizer;Gait belt Activity Tolerance: Patient tolerated treatment well Patient left: in chair;with call bell/phone within reach     Time: 7829-56211058-1128 PT Time Calculation (min): 30 min  Charges:  $Gait Training: 8-22 mins $Therapeutic Exercise: 8-22 mins                    G Codes:  Rica Koyanagi  PTA [facility]  Acute  Rehab Pager      [phone]

## 2014-08-01 NOTE — Progress Notes (Signed)
Patient requested glucose (CBG) check. Patient stated he woke up and wonder what is his glucose level. CBG result 154.

## 2014-08-01 NOTE — Progress Notes (Signed)
Subjective: 1 Day Post-Op Procedure(s) (LRB): TOTAL LEFT KNEE ARTHROPLASTY (Left) Patient reports pain as 2 on 0-10 scale.Doing well today. Hemovac DCd.    Objective: Vital signs in last 24 hours: Temp:  [97.6 F (36.4 C)-98.2 F (36.8 C)] 97.6 F (36.4 C) (10/01 0456) Pulse Rate:  [77-100] 95 (10/01 0456) Resp:  [9-18] 16 (10/01 0456) BP: (120-189)/(60-100) 128/68 mmHg (10/01 0456) SpO2:  [95 %-100 %] 99 % (10/01 0449)  Intake/Output from previous day: 09/30 0701 - 10/01 0700 In: 4914.7 [P.O.:820; I.V.:3884.7; IV Piggyback:210] Out: 2355 [Urine:2125; Drains:230] Intake/Output this shift: Total I/O In: -  Out: 200 [Urine:200]   Recent Labs  08/01/14 0428  HGB 11.3*    Recent Labs  08/01/14 0428  WBC 13.8*  RBC 3.73*  HCT 34.2*  PLT 249    Recent Labs  08/01/14 0428  NA 136*  K 4.4  CL 97  CO2 27  BUN 19  CREATININE 0.94  GLUCOSE 168*  CALCIUM 8.9   No results found for this basename: LABPT, INR,  in the last 72 hours  Compartment soft No problems with Calf.  Assessment/Plan: 1 Day Post-Op Procedure(s) (LRB): TOTAL LEFT KNEE ARTHROPLASTY (Left) Up with therapy  Dedria Endres A 08/01/2014, 7:32 AM
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
## 2014-08-02 LAB — CBC
HCT: 33.7 % — ABNORMAL LOW (ref 39.0–52.0)
Hemoglobin: 11 g/dL — ABNORMAL LOW (ref 13.0–17.0)
MCH: 30.2 pg (ref 26.0–34.0)
MCHC: 32.6 g/dL (ref 30.0–36.0)
MCV: 92.6 fL (ref 78.0–100.0)
PLATELETS: 213 10*3/uL (ref 150–400)
RBC: 3.64 MIL/uL — ABNORMAL LOW (ref 4.22–5.81)
RDW: 13.6 % (ref 11.5–15.5)
WBC: 10.3 10*3/uL (ref 4.0–10.5)

## 2014-08-02 LAB — BASIC METABOLIC PANEL
ANION GAP: 12 (ref 5–15)
BUN: 18 mg/dL (ref 6–23)
CALCIUM: 8.9 mg/dL (ref 8.4–10.5)
CO2: 28 mEq/L (ref 19–32)
CREATININE: 0.94 mg/dL (ref 0.50–1.35)
Chloride: 98 mEq/L (ref 96–112)
GFR calc Af Amer: >90 mL/min (ref >90)
GFR calc non Af Amer: 82 mL/min — ABNORMAL LOW (ref >90)
GLUCOSE: 144 mg/dL — AB (ref 70–99)
Potassium: 4.1 mEq/L (ref 3.7–5.3)
SODIUM: 138 meq/L (ref 137–147)

## 2014-08-02 LAB — GLUCOSE, CAPILLARY: GLUCOSE-CAPILLARY: 141 mg/dL — AB (ref 70–99)

## 2014-08-02 MED ORDER — CYCLOBENZAPRINE HCL 5 MG PO TABS: 5.0000 mg | ORAL_TABLET | Freq: Three times a day (TID) | ORAL | Status: DC | PRN

## 2014-08-02 MED ORDER — HYDROMORPHONE HCL 2 MG PO TABS: 2.0000 mg | ORAL_TABLET | ORAL | Status: DC | PRN

## 2014-08-02 MED ORDER — RIVAROXABAN 10 MG PO TABS: 10.0000 mg | ORAL_TABLET | Freq: Every day | ORAL | Status: DC

## 2014-08-02 NOTE — Progress Notes (Signed)
Physical Therapy Treatment Patient Details Name: [name] MRN: [id number] DOB: 05/19/1941 Today's Date: 08/02/2014    History of Present Illness L TKR - s/p R TKR 10/14    PT Comments      Follow Up Recommendations  Home health PT     Equipment Recommendations  None recommended by PT    Recommendations for Other Services OT consult     Precautions / Restrictions Precautions Precautions: Knee;Fall Required Braces or Orthoses: Knee Immobilizer - Left Knee Immobilizer - Left: Discontinue once straight leg raise with < 10 degree lag Restrictions Weight Bearing Restrictions: No Other Position/Activity Restrictions: WBAT    Mobility  Bed Mobility Overal bed mobility: Needs Assistance Bed Mobility: Supine to Sit     Supine to sit: Supervision     General bed mobility comments: Pt self assisting wL LE with gait belt  Transfers Overall transfer level: Needs assistance Equipment used: Rolling walker (2 wheeled) Transfers: Sit to/from Stand Sit to Stand: Supervision         General transfer comment: cues for use of UEs to self assist  Ambulation/Gait Ambulation/Gait assistance: Min guard;Supervision Ambulation Distance (Feet): 45 Feet Assistive device: Rolling walker (2 wheeled) Gait Pattern/deviations: Step-to pattern Gait velocity: decreased   General Gait Details: cues for position from [employer]   Stairs            Wheelchair Mobility    Modified Rankin (Stroke Patients Only)       Balance                                    Cognition Arousal/Alertness: Awake/alert Behavior During Therapy: WFL for tasks assessed/performed Overall Cognitive Status: Within Functional Limits for tasks assessed                      Exercises Total Joint Exercises Ankle Circles/Pumps: AROM;15 reps;Supine;Both Quad Sets: AROM;Both;15 reps;Supine Heel Slides: AAROM;15 reps;Left;Supine Straight Leg Raises: AROM;20  reps;Left;Supine Goniometric ROM: AAROM at L knee -10 - 50    General Comments        Pertinent Vitals/Pain Pain Assessment: 0-10 Pain Score: 5  Pain Location: L knee Pain Descriptors / Indicators: Aching;Sore Pain Intervention(s): Limited activity within patient's tolerance;Monitored during session;Premedicated before session;Ice applied    Home Living                      Prior Function            PT Goals (current goals can now be found in the care plan section) Acute Rehab PT Goals Patient Stated Goal: Resume previous lifestyle with decreased pain PT Goal Formulation: With patient Time For Goal Achievement: 08/07/14 Potential to Achieve Goals: Good Progress towards PT goals: Progressing toward goals    Frequency  7X/week    PT Plan Current plan remains appropriate    Co-evaluation             End of Session Equipment Utilized During Treatment: Left knee immobilizer;Gait belt Activity Tolerance: Patient tolerated treatment well Patient left: in chair;with call bell/phone within reach;with nursing/sitter in room     Time: 1100-1126 PT Time Calculation (min): 26 min  Charges:  $Gait Training: 8-22 mins $Therapeutic Exercise: 8-22 mins                    G Codes:      [name] 08/02/2014, 12:46 PM

## 2014-08-02 NOTE — Progress Notes (Signed)
   Subjective: 2 Days Post-Op Procedure(s) (LRB): TOTAL LEFT KNEE ARTHROPLASTY (Left) Patient reports pain as mild.   Patient seen in rounds without Dr. Darrelyn HillockGioffre. Patient is well, and has had no acute complaints or problems. No issues overnight. No SOB or chest pain. Reports that therapy is going well. He feels that he is well prepared to do therapy at home. Plan is to go Home after hospital stay.  Objective: Vital signs in last 24 hours: Temp:  [97.8 F (36.6 C)-98.7 F (37.1 C)] 97.8 F (36.6 C) (10/02 0558) Pulse Rate:  [82-114] 82 (10/02 0558) Resp:  [17-18] 17 (10/02 0558) BP: (109-139)/(61-79) 139/64 mmHg (10/02 0558) SpO2:  [96 %-99 %] 99 % (10/02 0558)  Intake/Output from previous day:  Intake/Output Summary (Last 24 hours) at 08/02/14 0732 Last data filed at 08/02/14 0555  Gross per 24 hour  Intake 394.67 ml  Output   1650 ml  Net -1255.33 ml     Labs:  Recent Labs  08/01/14 0428 08/02/14 0457  HGB 11.3* 11.0*    Recent Labs  08/01/14 0428 08/02/14 0457  WBC 13.8* 10.3  RBC 3.73* 3.64*  HCT 34.2* 33.7*  PLT 249 213    Recent Labs  08/01/14 0428 08/02/14 0457  NA 136* 138  K 4.4 4.1  CL 97 98  CO2 27 28  BUN 19 18  CREATININE 0.94 0.94  GLUCOSE 168* 144*  CALCIUM 8.9 8.9    EXAM General - Patient is Alert and Oriented Extremity - Neurologically intact Intact pulses distally Dorsiflexion/Plantar flexion intact Compartment soft Dressing/Incision - clean, dry, no drainage Motor Function - intact, moving foot and toes well on exam.   Past Medical History  Diagnosis Date  . Hypertension   . Hyperlipidemia   . Arthritis THUMB  . Diabetes mellitus, type 2     HAS "RAPID DROPS IN BLOOD SUGAR"  . Psoriasis     Assessment/Plan: 2 Days Post-Op Procedure(s) (LRB): TOTAL LEFT KNEE ARTHROPLASTY (Left) Active Problems:   Osteoarthritis of left knee   Total knee replacement status  Estimated body mass index is 29.95 kg/(m^2) as  calculated from the following:   Height as of this encounter: 5\' 5"  (1.651 m).   Weight as of this encounter: 81.647 kg (180 lb). Advance diet Up with therapy Discharge home with home health  DVT Prophylaxis - Xarelto Weight-Bearing as tolerated  Discharge home today with home health after PT. Discharge instructions given.   Dimitri PedAmber Zoeie Ritter, PA-C Orthopaedic Surgery 08/02/2014, 7:32 AM

## 2014-08-07 NOTE — Discharge Summary (Signed)
Physician Discharge Summary   Patient ID: [name] MRN: [id number] DOB/AGE: 12-24-1940 73 y.o.  Admit date: 07/31/2014 Discharge date: 08/02/2014  Primary Diagnosis: Osteoarthritis, left knee  Admission Diagnoses:  Past Medical History  Diagnosis Date  . Hypertension   . Hyperlipidemia   . Arthritis THUMB  . Diabetes mellitus, type 2     HAS "RAPID DROPS IN BLOOD SUGAR"  . Psoriasis    Discharge Diagnoses:   Active Problems:   Osteoarthritis of left knee   Total knee replacement status  Estimated body mass index is 29.95 kg/(m^2) as calculated from the following:   Height as of this encounter: $RemoveBeforeD'5\' 5"'[id number]$  (1.651 m).   Weight as of this encounter: 81.647 kg (180 lb).  Procedure:  Procedure(s) (LRB): TOTAL LEFT KNEE ARTHROPLASTY (Left)   Consults: None  HPI: [name], 73 y.o. male, has a history of pain and functional disability in the left knee due to arthritis and has failed non-surgical conservative treatments for greater than 12 weeks to includeNSAID's and/or analgesics, corticosteriod injections, viscosupplementation injections, use of assistive devices and activity modification. Onset of symptoms was gradual, starting 3 years ago with gradually worsening course since that time. The patient noted no past surgery on the left knee(s). Patient currently rates pain in the left knee(s) at 7 out of 10 with activity. Patient has night pain, worsening of pain with activity and weight bearing, pain that interferes with activities of daily living, pain with passive range of motion, crepitus and joint swelling. Patient has evidence of periarticular osteophytes and joint space narrowing by imaging studies. There is no active infection   Laboratory Data: Admission on 07/31/2014, Discharged on 08/02/2014  Component Date Value Ref Range Status  . Glucose-Capillary 07/31/2014 129* 70 - 99 mg/dL Final  . Comment 1 07/31/2014 Notify RN   Final  . Glucose-Capillary 07/31/2014 154* 70 -  99 mg/dL Final  . Comment 1 07/31/2014 Notify RN   Final  . Comment 2 07/31/2014 Documented in Chart   Final  . Glucose-Capillary 07/31/2014 182* 70 - 99 mg/dL Final  . Comment 1 07/31/2014 Documented in Chart   Final  . Comment 2 07/31/2014 Notify RN   Final  . Glucose-Capillary 07/31/2014 169* 70 - 99 mg/dL Final  . WBC 08/01/2014 13.8* 4.0 - 10.5 K/uL Final  . RBC 08/01/2014 3.73* 4.22 - 5.81 MIL/uL Final  . Hemoglobin 08/01/2014 11.3* 13.0 - 17.0 g/dL Final  . HCT 08/01/2014 34.2* 39.0 - 52.0 % Final  . MCV 08/01/2014 91.7  78.0 - 100.0 fL Final  . MCH 08/01/2014 30.3  26.0 - 34.0 pg Final  . MCHC 08/01/2014 33.0  30.0 - 36.0 g/dL Final  . RDW 08/01/2014 13.3  11.5 - 15.5 % Final  . Platelets 08/01/2014 249  150 - 400 K/uL Final  . Sodium 08/01/2014 136* 137 - 147 mEq/L Final  . Potassium 08/01/2014 4.4  3.7 - 5.3 mEq/L Final  . Chloride 08/01/2014 97  96 - 112 mEq/L Final  . CO2 08/01/2014 27  19 - 32 mEq/L Final  . Glucose, Bld 08/01/2014 168* 70 - 99 mg/dL Final  . BUN 08/01/2014 19  6 - 23 mg/dL Final  . Creatinine, Ser 08/01/2014 0.94  0.50 - 1.35 mg/dL Final  . Calcium 08/01/2014 8.9  8.4 - 10.5 mg/dL Final  . GFR calc non Af Amer 08/01/2014 82* >90 mL/min Final  . GFR calc Af Amer 08/01/2014 >90  >90 mL/min Final   Comment: (NOTE)  The eGFR has been calculated using the CKD EPI equation.                          This calculation has not been validated in all clinical situations.                          eGFR's persistently <90 mL/min signify possible Chronic Kidney                          Disease.  . Anion gap 08/01/2014 12  5 - 15 Final  . Glucose-Capillary 07/31/2014 178* 70 - 99 mg/dL Final  . Glucose-Capillary 07/31/2014 203* 70 - 99 mg/dL Final  . Glucose-Capillary 08/01/2014 154* 70 - 99 mg/dL Final  . Glucose-Capillary 08/01/2014 171* 70 - 99 mg/dL Final  . Comment 1 08/01/2014 Notify RN   Final  . Comment 2 08/01/2014 Documented in  Chart   Final  . Glucose-Capillary 08/01/2014 138* 70 - 99 mg/dL Final  . Comment 1 08/01/2014 Notify RN   Final  . Comment 2 08/01/2014 Documented in Chart   Final  . WBC 08/02/2014 10.3  4.0 - 10.5 K/uL Final  . RBC 08/02/2014 3.64* 4.22 - 5.81 MIL/uL Final  . Hemoglobin 08/02/2014 11.0* 13.0 - 17.0 g/dL Final  . HCT 08/02/2014 33.7* 39.0 - 52.0 % Final  . MCV 08/02/2014 92.6  78.0 - 100.0 fL Final  . MCH 08/02/2014 30.2  26.0 - 34.0 pg Final  . MCHC 08/02/2014 32.6  30.0 - 36.0 g/dL Final  . RDW 08/02/2014 13.6  11.5 - 15.5 % Final  . Platelets 08/02/2014 213  150 - 400 K/uL Final  . Sodium 08/02/2014 138  137 - 147 mEq/L Final  . Potassium 08/02/2014 4.1  3.7 - 5.3 mEq/L Final  . Chloride 08/02/2014 98  96 - 112 mEq/L Final  . CO2 08/02/2014 28  19 - 32 mEq/L Final  . Glucose, Bld 08/02/2014 144* 70 - 99 mg/dL Final  . BUN 08/02/2014 18  6 - 23 mg/dL Final  . Creatinine, Ser 08/02/2014 0.94  0.50 - 1.35 mg/dL Final  . Calcium 08/02/2014 8.9  8.4 - 10.5 mg/dL Final  . GFR calc non Af Amer 08/02/2014 82* >90 mL/min Final  . GFR calc Af Amer 08/02/2014 >90  >90 mL/min Final   Comment: (NOTE)                          The eGFR has been calculated using the CKD EPI equation.                          This calculation has not been validated in all clinical situations.                          eGFR's persistently <90 mL/min signify possible Chronic Kidney                          Disease.  . Anion gap 08/02/2014 12  5 - 15 Final  . Glucose-Capillary 08/01/2014 147* 70 - 99 mg/dL Final  . Comment 1 08/01/2014 Notify RN   Final  . Comment 2 08/01/2014 Documented in Chart   Final  . Glucose-Capillary 08/01/2014 215* 70 - 99  mg/dL Final  . Glucose-Capillary 08/02/2014 141* 70 - 99 mg/dL Final  . Comment 1 08/02/2014 Notify RN   Final  . Comment 2 08/02/2014 Documented in [facility] Outpatient Visit on 07/26/2014  Component Date Value Ref Range Status  . aPTT 07/26/2014 27   24 - 37 seconds Final  . Sodium 07/26/2014 139  137 - 147 mEq/L Final  . Potassium 07/26/2014 4.6  3.7 - 5.3 mEq/L Final  . Chloride 07/26/2014 99  96 - 112 mEq/L Final  . CO2 07/26/2014 26  19 - 32 mEq/L Final  . Glucose, Bld 07/26/2014 82  70 - 99 mg/dL Final  . BUN 07/26/2014 22  6 - 23 mg/dL Final  . Creatinine, Ser 07/26/2014 0.95  0.50 - 1.35 mg/dL Final  . Calcium 07/26/2014 9.8  8.4 - 10.5 mg/dL Final  . Total Protein 07/26/2014 7.1  6.0 - 8.3 g/dL Final  . Albumin 07/26/2014 4.0  3.5 - 5.2 g/dL Final  . AST 07/26/2014 16  0 - 37 U/L Final  . ALT 07/26/2014 21  0 - 53 U/L Final  . Alkaline Phosphatase 07/26/2014 47  39 - 117 U/L Final  . Total Bilirubin 07/26/2014 0.4  0.3 - 1.2 mg/dL Final  . GFR calc non Af Amer 07/26/2014 82* >90 mL/min Final  . GFR calc Af Amer 07/26/2014 >90  >90 mL/min Final   Comment: (NOTE)                          The eGFR has been calculated using the CKD EPI equation.                          This calculation has not been validated in all clinical situations.                          eGFR's persistently <90 mL/min signify possible Chronic Kidney                          Disease.  . Anion gap 07/26/2014 14  5 - 15 Final  . Prothrombin Time 07/26/2014 12.7  11.6 - 15.2 seconds Final  . INR 07/26/2014 0.95  0.00 - 1.49 Final  . Color, Urine 07/26/2014 YELLOW  YELLOW Final  . APPearance 07/26/2014 CLOUDY* CLEAR Final  . Specific Gravity, Urine 07/26/2014 1.023  1.005 - 1.030 Final  . pH 07/26/2014 6.0  5.0 - 8.0 Final  . Glucose, UA 07/26/2014 NEGATIVE  NEGATIVE mg/dL Final  . Hgb urine dipstick 07/26/2014 NEGATIVE  NEGATIVE Final  . Bilirubin Urine 07/26/2014 NEGATIVE  NEGATIVE Final  . Ketones, ur 07/26/2014 NEGATIVE  NEGATIVE mg/dL Final  . Protein, ur 07/26/2014 NEGATIVE  NEGATIVE mg/dL Final  . Urobilinogen, UA 07/26/2014 0.2  0.0 - 1.0 mg/dL Final  . Nitrite 07/26/2014 NEGATIVE  NEGATIVE Final  . Leukocytes, UA 07/26/2014 NEGATIVE  NEGATIVE  Final   MICROSCOPIC NOT DONE ON URINES WITH NEGATIVE PROTEIN, BLOOD, LEUKOCYTES, NITRITE, OR GLUCOSE <1000 mg/dL.  . WBC 07/26/2014 7.5  4.0 - 10.5 K/uL Final  . RBC 07/26/2014 4.49  4.22 - 5.81 MIL/uL Final  . Hemoglobin 07/26/2014 13.8  13.0 - 17.0 g/dL Final  . HCT 07/26/2014 40.6  39.0 - 52.0 % Final  . MCV 07/26/2014 90.4  78.0 - 100.0 fL Final  . [facility] 07/26/2014  30.7  26.0 - 34.0 pg Final  . MCHC 07/26/2014 34.0  30.0 - 36.0 g/dL Final  . RDW 07/30/5746 13.5  11.5 - 15.5 % Final  . Platelets 07/26/2014 234  150 - 400 K/uL Final  . MRSA, PCR 07/26/2014 NEGATIVE  NEGATIVE Final  . Staphylococcus aureus 07/26/2014 NEGATIVE  NEGATIVE Final   Comment:                                 The Xpert SA Assay (FDA                          approved for NASAL specimens                          in patients over 71 years of age),                          is one component of                          a comprehensive surveillance                          program.  Test performance has                          been validated by [employer] for patients greater                          than or equal to 70 year old.                          It is not intended                          to diagnose infection nor to                          guide or monitor treatment.  . ABO/RH(D) 07/26/2014 A NEG   Final  . Antibody Screen 07/26/2014 NEG   Final  . Sample Expiration 07/26/2014 08/03/2014   Final     X-Rays:Dg Chest 2 View  07/26/2014   CLINICAL DATA:  Preop left knee arthroplasty  EXAM: CHEST  2 VIEW  COMPARISON:  07/26/2013  FINDINGS: The heart size and mediastinal contours are within normal limits. Both lungs are clear. The visualized skeletal structures are unremarkable.  IMPRESSION: Negative exam.   Electronically Signed   By: Signa Kell M.D.   On: 07/26/2014 12:50    EKG: Orders placed during the hospital encounter of 07/26/14  . EKG 12-LEAD  . EKG 12-LEAD      Hospital Course: [name] is a 73 y.o. who was admitted to [facility]. They were brought to the operating room on 07/31/2014 and underwent Procedure(s): TOTAL LEFT KNEE ARTHROPLASTY.  Patient tolerated the procedure well and was later transferred to the recovery room and then to the orthopaedic floor for postoperative  care.  They were given PO and IV analgesics for pain control following their surgery.  They were given 24 hours of postoperative antibiotics of  Anti-infectives   Start     Dose/Rate Route Frequency Ordered Stop   07/31/14 1400  ceFAZolin (ANCEF) IVPB 1 g/50 mL premix     1 g 100 mL/hr over 30 Minutes Intravenous Every 6 hours 07/31/14 1152 07/31/14 2124   07/31/14 0811  polymyxin B 500,000 Units, bacitracin 50,000 Units in sodium chloride irrigation 0.9 % 500 mL irrigation  Status:  Discontinued       As needed 07/31/14 0812 07/31/14 0949   07/31/14 0552  ceFAZolin (ANCEF) IVPB 2 g/50 mL premix     2 g 100 mL/hr over 30 Minutes Intravenous On call to O.R. 07/31/14 0586 07/31/14 0733     and started on DVT prophylaxis in the form of Xarelto.   PT and OT were ordered for total joint protocol.  Discharge planning consulted to help with postop disposition and equipment needs.  Patient had a good night on the evening of surgery.  They started to get up OOB with therapy on day one. Hemovac drain was pulled without difficulty.  Continued to work with therapy into day two.  Dressing was changed on day two and the incision was clean and dry. Patient was seen in rounds and was ready to go home.   Diet: Diabetic diet Activity:WBAT Follow-up:in 2 weeks Disposition - Home Discharged Condition: stable   Discharge Instructions   Call MD / Call 911    Complete by:  As directed   If you experience chest pain or shortness of breath, CALL 911 and be transported to the hospital emergency room.  If you develope a fever above 101 F, pus (white drainage) or increased drainage or  redness at the wound, or calf pain, call your surgeon's office.     Change dressing    Complete by:  As directed   Change dressing daily with sterile 4 x 4 inch gauze dressing     Constipation Prevention    Complete by:  As directed   Drink plenty of fluids.  Prune juice may be helpful.  You may use a stool softener, such as Colace (over the counter) 100 mg twice a day.  Use MiraLax (over the counter) for constipation as needed.     Diet - low sodium heart healthy    Complete by:  As directed      Discharge instructions    Complete by:  As directed   Walk with your walker. Weight bearing as tolerated Home Health Agency will follow you at home for your therapy Change your dressing daily. Shower only, no tub bath. Call if any temperatures greater than 101 or any wound complications: [phone] during the day and ask for Dr. Jeannetta Ellis nurse, [facility] [place].     Do not put a pillow under the knee. Place it under the heel.    Complete by:  As directed      Driving restrictions    Complete by:  As directed   No driving for 2 weeks     Increase activity slowly as tolerated    Complete by:  As directed             Medication List         acetaminophen 650 MG CR tablet  Commonly known as:  TYLENOL  Take 1,300 mg by mouth daily.     AMARYL 2 MG  tablet  Generic drug:  glimepiride  Take 1 mg by mouth every morning.     cyclobenzaprine 5 MG tablet  Commonly known as:  FLEXERIL  Take 1 tablet (5 mg total) by mouth 3 (three) times daily as needed for muscle spasms.     ferrous sulfate 325 (65 FE) MG tablet  Take 1 tablet (325 mg total) by mouth 3 (three) times daily after meals.     HYDROmorphone 2 MG tablet  Commonly known as:  DILAUDID  Take 1 tablet (2 mg total) by mouth every 3 (three) hours as needed for severe pain (Q4-6 hours PRN).     losartan 100 MG tablet  Commonly known as:  COZAAR  Take 100 mg by mouth every morning.     metFORMIN 1000 MG tablet  Commonly known as:   GLUCOPHAGE  Take 1,000 mg by mouth 2 (two) times daily with a meal.     rivaroxaban 10 MG Tabs tablet  Commonly known as:  XARELTO  Take 1 tablet (10 mg total) by mouth daily with breakfast.     simvastatin 20 MG tablet  Commonly known as:  ZOCOR  Take 20 mg by mouth every evening.     traMADol 50 MG tablet  Commonly known as:  ULTRAM  Take 50 mg by mouth every 4 (four) hours as needed for moderate pain.           Follow-up Information   Follow up with [facility]. (home health physical therapy)    Contact information:   [address] [place] [address] [phone]       Follow up with GIOFFRE,RONALD A, MD. Schedule an appointment as soon as possible for a visit in 2 weeks.   Specialty:  Orthopedic Surgery   Contact information:   [address] [place] [address] [phone]       Signed: Ardeen Jourdain, PA-C Orthopaedic Surgery 08/07/2014, 2:52 PM

## 2015-05-02 ENCOUNTER — Other Ambulatory Visit: Payer: Self-pay | Admitting: Gastroenterology

## 2015-05-08 IMAGING — CR DG CHEST 2V
2 series · 2 of 2 positions shown · non-contrast
Comparison: 07/26/2013

CLINICAL DATA: Preop left knee arthroplasty

EXAM:
CHEST  2 VIEW

[w chest pa]
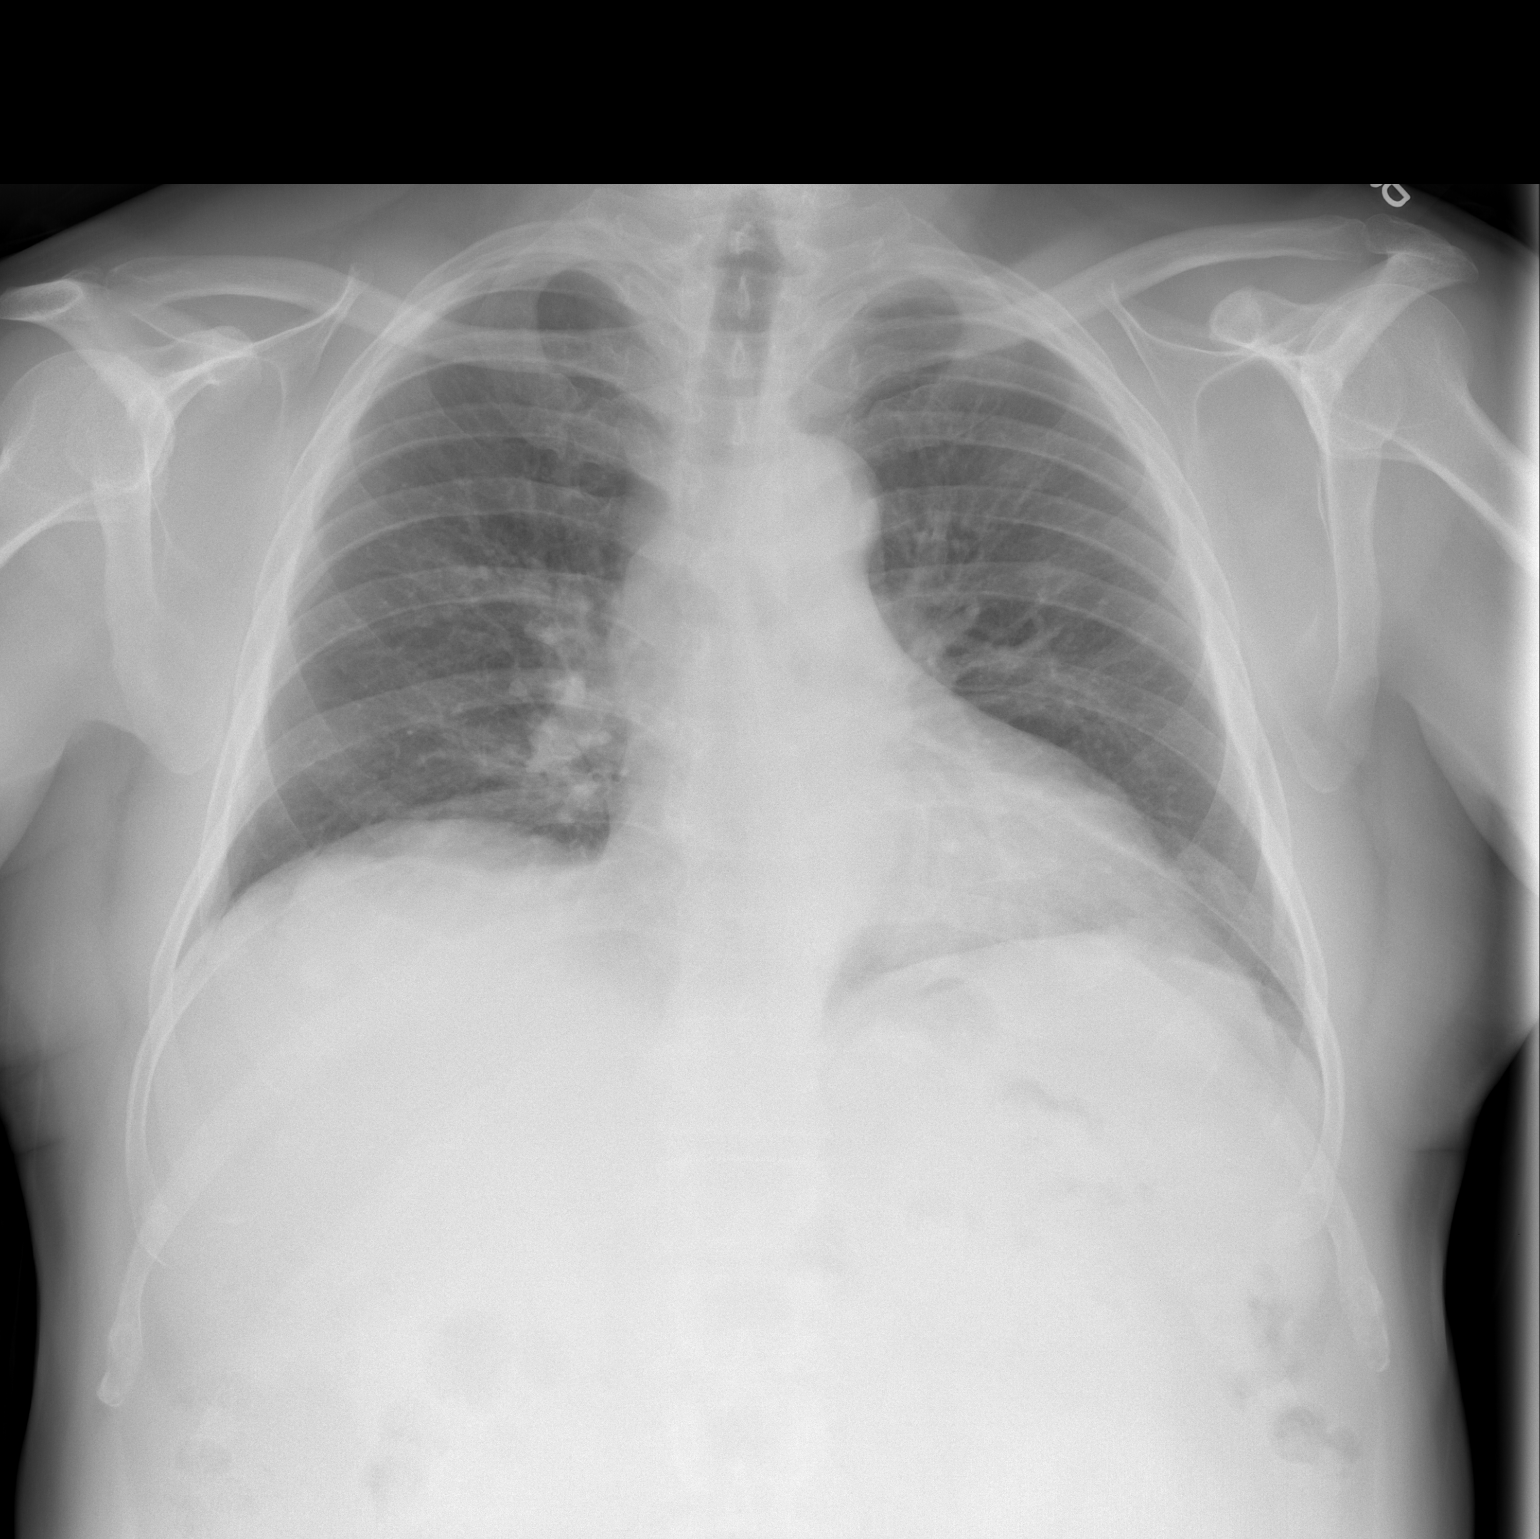

[w chest lat]
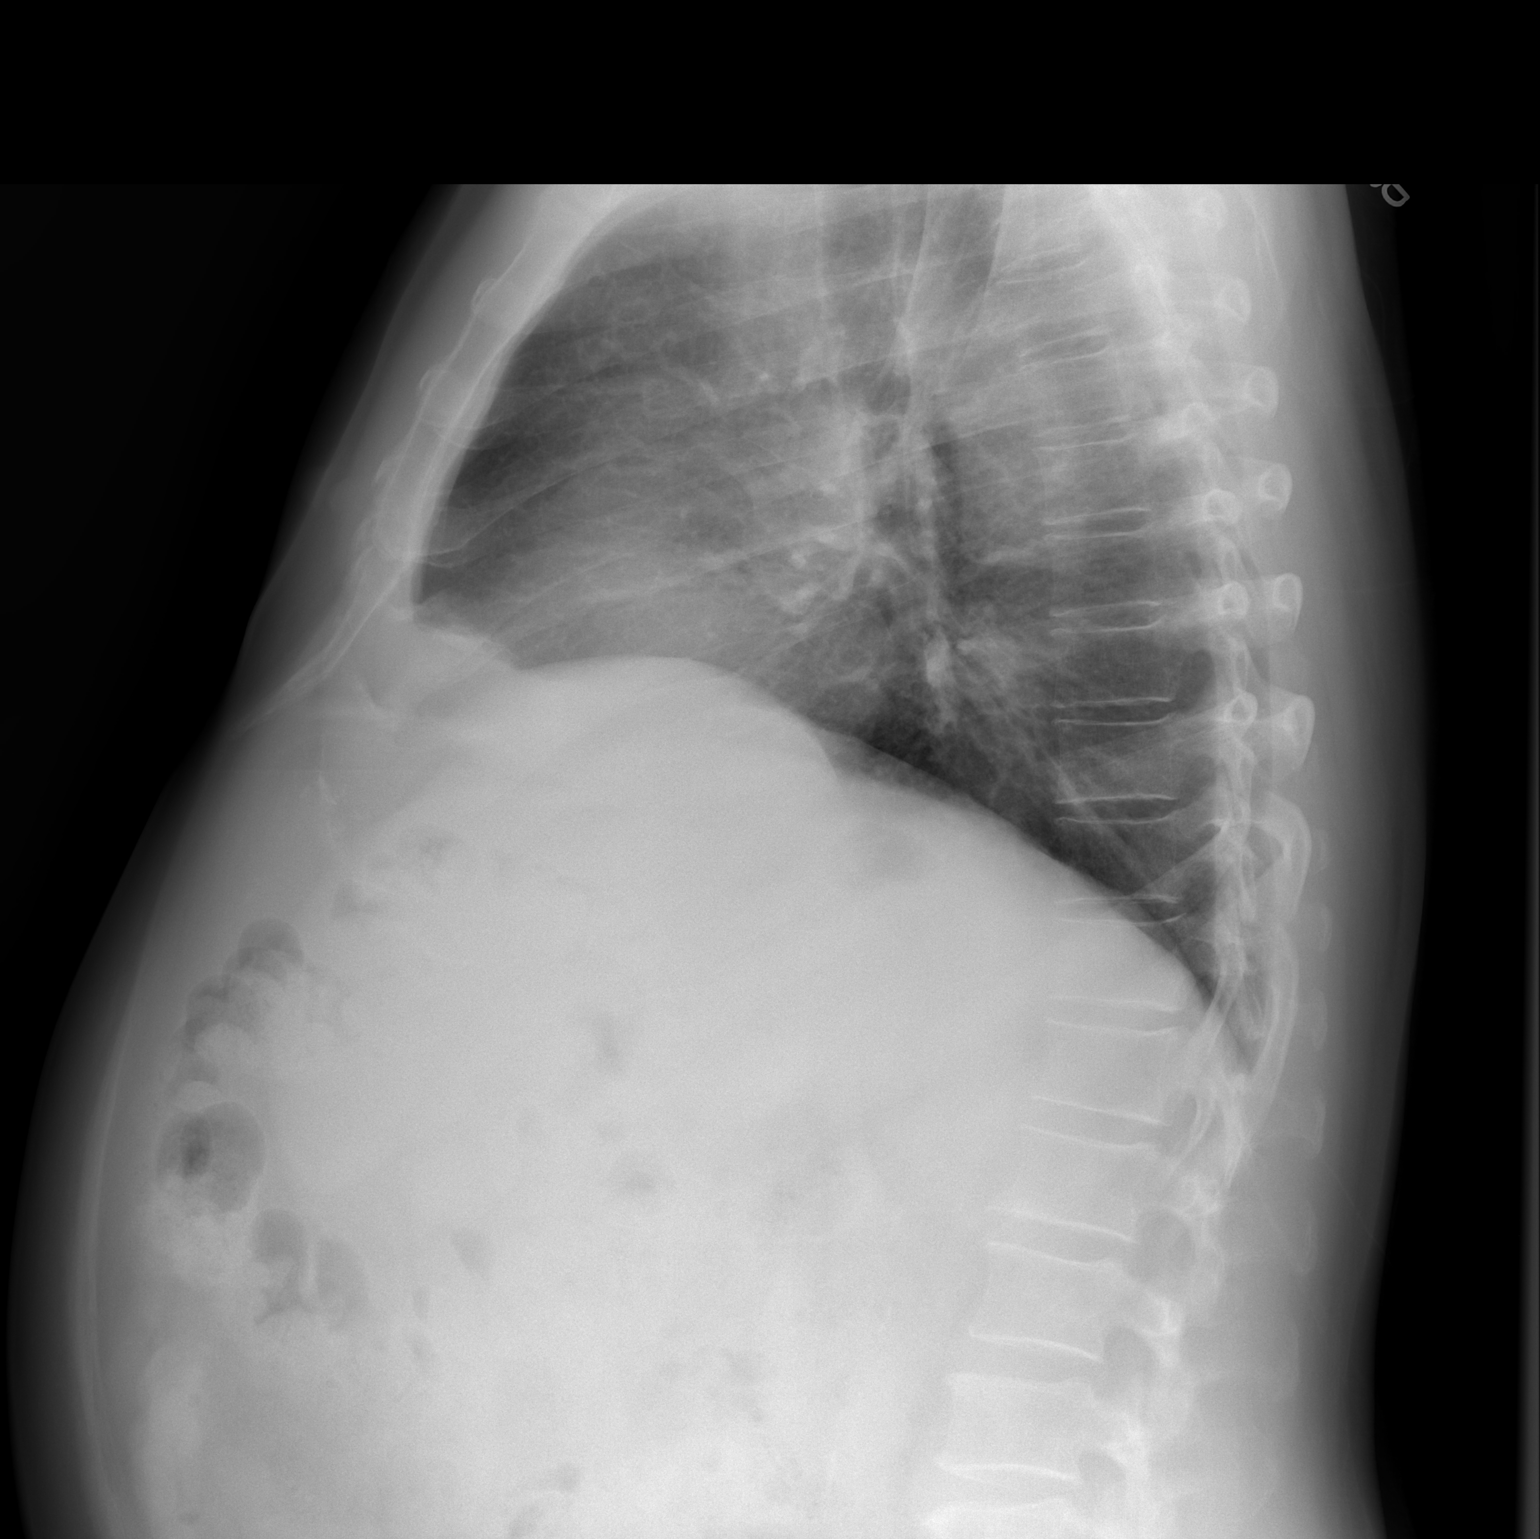

[2 of 2 positions shown; findings below may reference images not displayed]

FINDINGS: The heart size and mediastinal contours are within normal limits.
Both lungs are clear. The visualized skeletal structures are
unremarkable.
IMPRESSION: Negative exam.

## 2015-05-12 ENCOUNTER — Encounter (HOSPITAL_COMMUNITY): Payer: Self-pay | Admitting: *Deleted

## 2015-05-15 ENCOUNTER — Other Ambulatory Visit: Payer: Self-pay | Admitting: Gastroenterology

## 2015-05-18 NOTE — Anesthesia Preprocedure Evaluation (Addendum)
Anesthesia Evaluation  Patient identified by MRN, date of birth, ID band Patient awake    Reviewed: Allergy & Precautions, NPO status , Patient's Chart, lab work & pertinent test results  History of Anesthesia Complications Negative for: history of anesthetic complications  Airway Mallampati: II  TM Distance: >3 FB Neck ROM: Full    Dental no notable dental hx. (+) Dental Advisory Given   Pulmonary former smoker,  breath sounds clear to auscultation  Pulmonary exam normal       Cardiovascular hypertension, Pt. on medications Normal cardiovascular examRhythm:Regular Rate:Normal     Neuro/Psych negative neurological ROS  negative psych ROS   GI/Hepatic negative GI ROS, Neg liver ROS,   Endo/Other  diabetes, Type 2, Oral Hypoglycemic Agents  Renal/GU negative Renal ROS  negative genitourinary   Musculoskeletal  (+) Arthritis -, Osteoarthritis,    Abdominal   Peds negative pediatric ROS (+)  Hematology negative hematology ROS (+)   Anesthesia Other Findings   Reproductive/Obstetrics negative OB ROS                            Anesthesia Physical Anesthesia Plan  ASA: II  Anesthesia Plan: MAC   Post-op Pain Management:    Induction: Intravenous  Airway Management Planned: Nasal Cannula  Additional Equipment:   Intra-op Plan:   Post-operative Plan:   Informed Consent: I have reviewed the patients History and Physical, chart, labs and discussed the procedure including the risks, benefits and alternatives for the proposed anesthesia with the patient or authorized representative who has indicated his/her understanding and acceptance.   Dental advisory given  Plan Discussed with: CRNA  Anesthesia Plan Comments:         Anesthesia Quick Evaluation

## 2015-05-19 ENCOUNTER — Ambulatory Visit (HOSPITAL_COMMUNITY): Payer: Medicare Other | Admitting: Anesthesiology

## 2015-05-19 ENCOUNTER — Ambulatory Visit (HOSPITAL_COMMUNITY)
Admission: RE | Admit: 2015-05-19 | Discharge: 2015-05-19 | Disposition: A | Discharge status: Home / Self Care | Payer: Medicare Other | Source: Ambulatory Visit | Attending: Gastroenterology | Admitting: Gastroenterology

## 2015-05-19 ENCOUNTER — Other Ambulatory Visit: Payer: Self-pay | Admitting: Gastroenterology

## 2015-05-19 ENCOUNTER — Encounter (HOSPITAL_COMMUNITY)
Admission: RE | Disposition: A | Discharge status: Home / Self Care | Payer: Self-pay | Source: Ambulatory Visit | Attending: Gastroenterology

## 2015-05-19 ENCOUNTER — Encounter (HOSPITAL_COMMUNITY): Payer: Self-pay

## 2015-05-19 DIAGNOSIS — M1990 Unspecified osteoarthritis, unspecified site: Secondary | ICD-10-CM | POA: Diagnosis not present

## 2015-05-19 DIAGNOSIS — E11319 Type 2 diabetes mellitus with unspecified diabetic retinopathy without macular edema: Secondary | ICD-10-CM | POA: Insufficient documentation

## 2015-05-19 DIAGNOSIS — Z79899 Other long term (current) drug therapy: Secondary | ICD-10-CM | POA: Diagnosis not present

## 2015-05-19 DIAGNOSIS — I10 Essential (primary) hypertension: Secondary | ICD-10-CM | POA: Diagnosis not present

## 2015-05-19 DIAGNOSIS — Z96652 Presence of left artificial knee joint: Secondary | ICD-10-CM | POA: Diagnosis not present

## 2015-05-19 DIAGNOSIS — Z87891 Personal history of nicotine dependence: Secondary | ICD-10-CM | POA: Insufficient documentation

## 2015-05-19 DIAGNOSIS — Z1211 Encounter for screening for malignant neoplasm of colon: Secondary | ICD-10-CM | POA: Diagnosis present

## 2015-05-19 DIAGNOSIS — K5730 Diverticulosis of large intestine without perforation or abscess without bleeding: Secondary | ICD-10-CM | POA: Diagnosis not present

## 2015-05-19 HISTORY — PX: COLONOSCOPY WITH PROPOFOL: SHX5780

## 2015-05-19 SURGERY — COLONOSCOPY WITH PROPOFOL
Anesthesia: Monitor Anesthesia Care

## 2015-05-19 MED ORDER — LIDOCAINE HCL (CARDIAC) 20 MG/ML IV SOLN
INTRAVENOUS | Status: DC | PRN
  Administered 2015-05-19: 100 mg via INTRAVENOUS

## 2015-05-19 MED ORDER — PROPOFOL 10 MG/ML IV BOLUS
INTRAVENOUS | Status: DC | PRN
Start: 2015-05-19 — End: 2015-05-19
  Administered 2015-05-19 (×2): 20 mg via INTRAVENOUS

## 2015-05-19 MED ORDER — PROPOFOL 10 MG/ML IV BOLUS
INTRAVENOUS | Status: AC
  Filled 2015-05-19: qty 20

## 2015-05-19 MED ORDER — SODIUM CHLORIDE 0.9 % IV SOLN: INTRAVENOUS | Status: DC

## 2015-05-19 MED ORDER — PROPOFOL INFUSION 10 MG/ML OPTIME
INTRAVENOUS | Status: DC | PRN
  Administered 2015-05-19: 140 ug/kg/min via INTRAVENOUS

## 2015-05-19 MED ORDER — ONDANSETRON HCL 4 MG/2ML IJ SOLN
INTRAMUSCULAR | Status: AC
  Filled 2015-05-19: qty 2

## 2015-05-19 MED ORDER — ONDANSETRON HCL 4 MG/2ML IJ SOLN
INTRAMUSCULAR | Status: DC | PRN
  Administered 2015-05-19: 4 mg via INTRAVENOUS

## 2015-05-19 MED ORDER — LIDOCAINE HCL (CARDIAC) 20 MG/ML IV SOLN
INTRAVENOUS | Status: AC
  Filled 2015-05-19: qty 5

## 2015-05-19 MED ORDER — LACTATED RINGERS IV SOLN
INTRAVENOUS | Status: DC
  Administered 2015-05-19: 1000 mL via INTRAVENOUS

## 2015-05-19 SURGICAL SUPPLY — 22 items

## 2015-05-19 NOTE — H&P (Signed)
  Procedure: Repeat screening colonoscopy. Normal screening colonoscopy performed on 09/10/2005  History: The patient is a 74 year old male born [birth year]. He is scheduled to undergo a repeat screening colonoscopy today.  Allergies: Lotensin cause cough  Past medical history: Total left knee replacement surgery. Ventral hernia repair. Right thumb tendon repair surgery. Type 2 diabetes mellitus with diabetic retinopathy. Hypertension. Hypercholesterolemia. Psoriasis. Allergic rhinitis.  Exam: The patient is alert and lying comfortably on the endoscopy stretcher. Abdomen is soft and nontender to palpation. Lungs are clear to auscultation. Cardiac exam reveals a regular rhythm.  Plan: Proceed with repeat screening colonoscopy

## 2015-05-19 NOTE — Op Note (Signed)
Procedure: Screening colonoscopy. Normal screening colonoscopy performed on 09/10/2005  Endoscopist: Danise EdgeMartin Jaecob Lowden  Premedication: Propofol administered by anesthesia  Procedure: The patient was placed in the left lateral decubitus position. Anal inspection and digital rectal exam were normal. The Pentax pediatric colonoscope was introduced into the rectum and advanced to the cecum. A normal-appearing ileocecal valve and appendiceal orifice were identified. Colonic preparation for the exam today was good. Withdrawal time was 12 minutes  Rectum. Normal. Retroflexed view of the distal rectum was normal  Sigmoid colon and descending colon. Left colonic diverticulosis  Splenic flexure. Normal  Transverse colon. Normal  Hepatic flexure. Normal  Ascending colon. Normal  Cecum and ileocecal valve. Normal  Assessment: Normal screening colonoscopy

## 2015-05-19 NOTE — Transfer of Care (Signed)
Immediate Anesthesia Transfer of Care Note  Patient: [name]  Procedure(s) Performed: Procedure(s): COLONOSCOPY WITH PROPOFOL (N/A)  Patient Location: ENDO  Anesthesia Type:MAC  Level of Consciousness:  sedated, patient cooperative and responds to stimulation  Airway & Oxygen Therapy:Patient Spontanous Breathing and Patient connected to face mask oxgen  Post-op Assessment:  Report given to endoscopy RN and Post -op Vital signs reviewed and stable  Post vital signs:  Reviewed and stable  Last Vitals:  Filed Vitals:   05/19/15 0936  BP: 159/91  Pulse: 67  Temp: 36.4 C  Resp: 13    Complications: No apparent anesthesia complications

## 2015-05-19 NOTE — Discharge Instructions (Signed)
Colonoscopy, Care After °These instructions give you information on caring for yourself after your procedure. Your doctor may also give you more specific instructions. Call your doctor if you have any problems or questions after your procedure. °HOME CARE °· Do not drive for 24 hours. °· Do not sign important papers or use machinery for 24 hours. °· You may shower. °· You may go back to your usual activities, but go slower for the first 24 hours. °· Take rest breaks often during the first 24 hours. °· Walk around or use warm packs on your belly (abdomen) if you have belly cramping or gas. °· Drink enough fluids to keep your pee (urine) clear or pale yellow. °· Resume your normal diet. Avoid heavy or fried foods. °· Avoid drinking alcohol for 24 hours or as told by your doctor. °· Only take medicines as told by your doctor. °If a tissue sample (biopsy) was taken during the procedure:  °· Do not take aspirin or blood thinners for 7 days, or as told by your doctor. °· Do not drink alcohol for 7 days, or as told by your doctor. °· Eat soft foods for the first 24 hours. °GET HELP IF: °You still have a small amount of blood in your poop (stool) 2-3 days after the procedure. °GET HELP RIGHT AWAY IF: °· You have more than a small amount of blood in your poop. °· You see clumps of tissue (blood clots) in your poop. °· Your belly is puffy (swollen). °· You feel sick to your stomach (nauseous) or throw up (vomit). °· You have a fever. °· You have belly pain that gets worse and medicine does not help. °MAKE SURE YOU: °· Understand these instructions. °· Will watch your condition. °· Will get help right away if you are not doing well or get worse. °Document Released: 11/20/2010 Document Revised: 10/23/2013 Document Reviewed: 06/25/2013 °Exit[employer]® Patient Information ©2015 [employer]. This information is not intended to replace advice given to you by your health care provider. Make sure you discuss any questions you have with  your health care provider. ° °

## 2015-05-19 NOTE — Anesthesia Postprocedure Evaluation (Signed)
  Anesthesia Post-op Note  Patient: William MeresDesmond [name]  Procedure(s) Performed: Procedure(s) (LRB): COLONOSCOPY WITH PROPOFOL (N/A)  Patient Location: PACU  Anesthesia Type: MAC  Level of Consciousness: awake and alert   Airway and Oxygen Therapy: Patient Spontanous Breathing  Post-op Pain: mild  Post-op Assessment: Post-op Vital signs reviewed, Patient's Cardiovascular Status Stable, Respiratory Function Stable, Patent Airway and No signs of Nausea or vomiting  Last Vitals:  Filed Vitals:   05/19/15 1110  BP: 141/63  Pulse: 65  Temp:   Resp: 18    Post-op Vital Signs: stable   Complications: No apparent anesthesia complications

## 2015-05-20 SURGERY — COLONOSCOPY
Anesthesia: Moderate Sedation

## 2015-05-22 ENCOUNTER — Encounter (HOSPITAL_COMMUNITY): Payer: Self-pay | Admitting: Gastroenterology

## 2015-11-02 DIAGNOSIS — I350 Nonrheumatic aortic (valve) stenosis: Secondary | ICD-10-CM

## 2015-11-02 HISTORY — DX: Nonrheumatic aortic (valve) stenosis: I35.0

## 2015-11-02 HISTORY — PX: TRANSTHORACIC ECHOCARDIOGRAM: SHX275

## 2015-11-18 ENCOUNTER — Other Ambulatory Visit (HOSPITAL_COMMUNITY): Payer: Self-pay | Admitting: Internal Medicine

## 2015-11-18 DIAGNOSIS — R011 Cardiac murmur, unspecified: Secondary | ICD-10-CM

## 2015-11-27 ENCOUNTER — Ambulatory Visit (HOSPITAL_COMMUNITY): Payer: Medicare Other | Attending: Cardiovascular Disease

## 2015-11-27 ENCOUNTER — Other Ambulatory Visit: Payer: Self-pay

## 2015-11-27 DIAGNOSIS — D649 Anemia, unspecified: Secondary | ICD-10-CM | POA: Insufficient documentation

## 2015-11-27 DIAGNOSIS — I350 Nonrheumatic aortic (valve) stenosis: Secondary | ICD-10-CM | POA: Insufficient documentation

## 2015-11-27 DIAGNOSIS — R011 Cardiac murmur, unspecified: Secondary | ICD-10-CM | POA: Insufficient documentation

## 2015-11-27 DIAGNOSIS — I10 Essential (primary) hypertension: Secondary | ICD-10-CM | POA: Insufficient documentation

## 2015-11-27 DIAGNOSIS — M1990 Unspecified osteoarthritis, unspecified site: Secondary | ICD-10-CM | POA: Diagnosis not present

## 2018-01-25 ENCOUNTER — Other Ambulatory Visit: Payer: Self-pay

## 2018-01-25 ENCOUNTER — Emergency Department (HOSPITAL_COMMUNITY)
Admission: EM | Admit: 2018-01-25 | Discharge: 2018-01-26 | Disposition: A | Discharge status: Home / Self Care | Payer: Medicare Other | Attending: Emergency Medicine | Admitting: Emergency Medicine

## 2018-01-25 ENCOUNTER — Emergency Department (HOSPITAL_COMMUNITY): Payer: Medicare Other

## 2018-01-25 ENCOUNTER — Encounter (HOSPITAL_COMMUNITY): Payer: Self-pay | Admitting: Emergency Medicine

## 2018-01-25 DIAGNOSIS — S81851A Open bite, right lower leg, initial encounter: Secondary | ICD-10-CM | POA: Insufficient documentation

## 2018-01-25 DIAGNOSIS — Z7984 Long term (current) use of oral hypoglycemic drugs: Secondary | ICD-10-CM | POA: Insufficient documentation

## 2018-01-25 DIAGNOSIS — Z79899 Other long term (current) drug therapy: Secondary | ICD-10-CM | POA: Insufficient documentation

## 2018-01-25 DIAGNOSIS — Y939 Activity, unspecified: Secondary | ICD-10-CM | POA: Insufficient documentation

## 2018-01-25 DIAGNOSIS — Z23 Encounter for immunization: Secondary | ICD-10-CM | POA: Insufficient documentation

## 2018-01-25 DIAGNOSIS — W540XXA Bitten by dog, initial encounter: Secondary | ICD-10-CM | POA: Diagnosis not present

## 2018-01-25 DIAGNOSIS — E119 Type 2 diabetes mellitus without complications: Secondary | ICD-10-CM | POA: Insufficient documentation

## 2018-01-25 DIAGNOSIS — I10 Essential (primary) hypertension: Secondary | ICD-10-CM | POA: Insufficient documentation

## 2018-01-25 DIAGNOSIS — Y999 Unspecified external cause status: Secondary | ICD-10-CM | POA: Insufficient documentation

## 2018-01-25 DIAGNOSIS — Z87891 Personal history of nicotine dependence: Secondary | ICD-10-CM | POA: Diagnosis not present

## 2018-01-25 DIAGNOSIS — Y929 Unspecified place or not applicable: Secondary | ICD-10-CM | POA: Diagnosis not present

## 2018-01-25 DIAGNOSIS — L03115 Cellulitis of right lower limb: Secondary | ICD-10-CM

## 2018-01-25 LAB — PROTIME-INR
INR: 1.06
PROTHROMBIN TIME: 13.7 s (ref 11.4–15.2)

## 2018-01-25 LAB — URINALYSIS, ROUTINE W REFLEX MICROSCOPIC
BACTERIA UA: NONE SEEN
Bilirubin Urine: NEGATIVE
Glucose, UA: NEGATIVE mg/dL
Hgb urine dipstick: NEGATIVE
Ketones, ur: 5 mg/dL — AB
Nitrite: NEGATIVE
Protein, ur: NEGATIVE mg/dL
SPECIFIC GRAVITY, URINE: 1.02 (ref 1.005–1.030)
SQUAMOUS EPITHELIAL / LPF: NONE SEEN
pH: 5 (ref 5.0–8.0)

## 2018-01-25 LAB — I-STAT CG4 LACTIC ACID, ED: Lactic Acid, Venous: 1.64 mmol/L (ref 0.5–1.9)

## 2018-01-25 LAB — COMPREHENSIVE METABOLIC PANEL
ALK PHOS: 40 U/L (ref 38–126)
ALT: 20 U/L (ref 17–63)
ANION GAP: 13 (ref 5–15)
AST: 21 U/L (ref 15–41)
Albumin: 4 g/dL (ref 3.5–5.0)
BUN: 16 mg/dL (ref 6–20)
CALCIUM: 10 mg/dL (ref 8.9–10.3)
CHLORIDE: 99 mmol/L — AB (ref 101–111)
CO2: 23 mmol/L (ref 22–32)
Creatinine, Ser: 1.07 mg/dL (ref 0.61–1.24)
GFR calc non Af Amer: >60 mL/min (ref >60)
Glucose, Bld: 155 mg/dL — ABNORMAL HIGH (ref 65–99)
Potassium: 3.9 mmol/L (ref 3.5–5.1)
Sodium: 135 mmol/L (ref 135–145)
Total Bilirubin: 0.8 mg/dL (ref 0.3–1.2)
Total Protein: 6.6 g/dL (ref 6.5–8.1)

## 2018-01-25 LAB — CBC WITH DIFFERENTIAL/PLATELET
Basophils Absolute: 0 10*3/uL (ref 0.0–0.1)
Basophils Relative: 0 %
Eosinophils Absolute: 0 10*3/uL (ref 0.0–0.7)
Eosinophils Relative: 0 %
HCT: 41.4 % (ref 39.0–52.0)
Hemoglobin: 13.8 g/dL (ref 13.0–17.0)
Lymphocytes Relative: 7 %
Lymphs Abs: 1.3 10*3/uL (ref 0.7–4.0)
MCH: 30.8 pg (ref 26.0–34.0)
MCHC: 33.3 g/dL (ref 30.0–36.0)
MCV: 92.4 fL (ref 78.0–100.0)
Monocytes Absolute: 0.8 10*3/uL (ref 0.1–1.0)
Monocytes Relative: 5 %
NEUTROS PCT: 88 %
Neutro Abs: 15.3 10*3/uL — ABNORMAL HIGH (ref 1.7–7.7)
Platelets: 204 10*3/uL (ref 150–400)
RBC: 4.48 MIL/uL (ref 4.22–5.81)
RDW: 13.5 % (ref 11.5–15.5)
WBC: 17.4 10*3/uL — ABNORMAL HIGH (ref 4.0–10.5)

## 2018-01-25 LAB — CBG MONITORING, ED: GLUCOSE-CAPILLARY: 138 mg/dL — AB (ref 65–99)

## 2018-01-25 NOTE — ED Triage Notes (Signed)
Reports being bit by the neighbors dog on Sunday.  Reports feeling nauseated and like he is going to pass out today.  [facility] at [facility] who sent him here for possible blood infection.

## 2018-01-25 NOTE — ED Notes (Signed)
Noted to be pale, shaky, and diaphoretic.  Checked CBG at this time.  Noted to be 138.  Taken to [facility].

## 2018-01-26 MED ORDER — SULFAMETHOXAZOLE-TRIMETHOPRIM 800-160 MG PO TABS: 1.0000 | ORAL_TABLET | Freq: Two times a day (BID) | ORAL | 0 refills | Status: AC

## 2018-01-26 MED ORDER — AMOXICILLIN-POT CLAVULANATE 875-125 MG PO TABS
1.0000 | ORAL_TABLET | Freq: Once | ORAL | Status: AC
Start: 2018-01-26 — End: 2018-01-26
  Administered 2018-01-26: 1 via ORAL
  Filled 2018-01-26: qty 1

## 2018-01-26 MED ORDER — TETANUS-DIPHTH-ACELL PERTUSSIS 5-2.5-18.5 LF-MCG/0.5 IM SUSP
0.5000 mL | Freq: Once | INTRAMUSCULAR | Status: AC
  Administered 2018-01-26: 0.5 mL via INTRAMUSCULAR
  Filled 2018-01-26: qty 0.5

## 2018-01-26 MED ORDER — PROBIOTIC PO CAPS: 1.0000 | ORAL_CAPSULE | Freq: Every day | ORAL | 1 refills | Status: DC

## 2018-01-26 MED ORDER — SULFAMETHOXAZOLE-TRIMETHOPRIM 800-160 MG PO TABS
1.0000 | ORAL_TABLET | Freq: Once | ORAL | Status: AC
  Administered 2018-01-26: 1 via ORAL
  Filled 2018-01-26: qty 1

## 2018-01-26 MED ORDER — AMOXICILLIN-POT CLAVULANATE 875-125 MG PO TABS: 1.0000 | ORAL_TABLET | Freq: Two times a day (BID) | ORAL | 0 refills | Status: DC

## 2018-01-26 NOTE — Discharge Instructions (Signed)
You may take Tylenol 1000 mg every 6 hours as needed for fever and pain. 

## 2018-01-26 NOTE — ED Notes (Signed)
Temp. 98.7 oral

## 2018-01-26 NOTE — ED Provider Notes (Signed)
TIME SEEN: 12:39 AM  CHIEF COMPLAINT: Dog bite to the right leg  HPI: Patient is a 77 year old male with history of non-insulin-dependent diabetes, hypertension, hyperlipidemia who presents to the emergency department after he was bit in the leg by a neighbors dog on March 24.  States that the dog is fully vaccinated.  He is not sure when his last tetanus vaccination was.  States that yesterday he started having fevers, felt bad, had headache and felt dizzy.  Went to urgent care today and he states they told him that he could "have poison in his blood" and told him to come to the emergency department.  States right now he feels fantastic and would like to go home.  He has no chest pain or shortness of breath.  He did have some nausea and vomiting.  No diarrhea.  No abdominal pain.  Did have fever at home of 101.4.   ROS: See HPI Constitutional:  fever  Eyes: no drainage  ENT: no runny nose   Cardiovascular:  no chest pain  Resp: no SOB  GI:  vomiting GU: no dysuria Integumentary: no rash  Allergy: no hives  Musculoskeletal: no leg swelling  Neurological: no slurred speech ROS otherwise negative  PAST MEDICAL HISTORY/PAST SURGICAL HISTORY:  Past Medical History:  Diagnosis Date  . Arthritis THUMB  . Diabetes mellitus, type 2 (HCC)    HAS "RAPID DROPS IN BLOOD SUGAR"  . Hyperlipidemia   . Hypertension   . Psoriasis     MEDICATIONS:  Prior to Admission medications   Medication Sig Start Date End Date Taking? Authorizing Provider  acetaminophen (TYLENOL) 650 MG CR tablet Take 1,300 mg by mouth daily.    [provider]  cyclobenzaprine (FLEXERIL) 5 MG tablet Take 1 tablet (5 mg total) by mouth 3 (three) times daily as needed for muscle spasms. 08/02/14   Constable, Amber, PA-C  ferrous sulfate 325 (65 FE) MG tablet Take 1 tablet (325 mg total) by mouth 3 (three) times daily after meals. 08/02/13   Constable, Amber, PA-C  glimepiride (AMARYL) 2 MG tablet Take 1 mg by mouth  every morning.    [provider]  HYDROmorphone (DILAUDID) 2 MG tablet Take 1 tablet (2 mg total) by mouth every 3 (three) hours as needed for severe pain (Q4-6 hours PRN). 08/02/14   Constable, Amber, PA-C  losartan (COZAAR) 100 MG tablet Take 100 mg by mouth every morning.     [provider]  metFORMIN (GLUCOPHAGE) 1000 MG tablet Take 1,000 mg by mouth 2 (two) times daily with a meal.    [provider]  rivaroxaban (XARELTO) 10 MG TABS tablet Take 1 tablet (10 mg total) by mouth daily with breakfast. 08/02/14   Celedonio Savageonstable, Amber, PA-C  simvastatin (ZOCOR) 20 MG tablet Take 20 mg by mouth every evening.    [provider]  traMADol (ULTRAM) 50 MG tablet Take 50 mg by mouth every 4 (four) hours as needed for moderate pain.    [provider]    ALLERGIES:  Allergies  Allergen Reactions  . Latex Dermatitis    SOCIAL HISTORY:  Social History   Tobacco Use  . Smoking status: Former Smoker    Packs/day: 1.00    Years: 10.00    Pack years: 10.00    Last attempt to quit: 07/27/1979    Years since quitting: 38.5  . Smokeless tobacco: Never Used  Substance Use Topics  . Alcohol use: No    FAMILY HISTORY: Family  History  Problem Relation Age of Onset  . Diabetes Mother   . Diabetes Sister   . Diabetes Brother     EXAM: BP 104/67 (BP Location: Right Arm)   Pulse (!) 112   Temp 98.4 F (36.9 C) (Oral)   Resp 18   Ht 5\' 5"  (1.651 m)   Wt 75.3 kg (166 lb)   SpO2 95%   BMI 27.62 kg/m  CONSTITUTIONAL: Alert and oriented and responds appropriately to questions. Well-appearing; well-nourished, afebrile, nontoxic, in no distress HEAD: Normocephalic EYES: Conjunctivae clear, pupils appear equal, EOMI ENT: normal nose; moist mucous membranes NECK: Supple, no meningismus, no nuchal rigidity, no LAD  CARD: Initially tachycardic in triage but this has resolved and he is now in a RRR; S1 and S2 appreciated; no murmurs, no clicks, no rubs, no  gallops RESP: Normal chest excursion without splinting or tachypnea; breath sounds clear and equal bilaterally; no wheezes, no rhonchi, no rales, no hypoxia or respiratory distress, speaking full sentences ABD/GI: Normal bowel sounds; non-distended; soft, non-tender, no rebound, no guarding, no peritoneal signs, no hepatosplenomegaly BACK:  The back appears normal and is non-tender to palpation, there is no CVA tenderness EXT: Normal ROM in all joints; non-tender to palpation; no edema; normal capillary refill; no cyanosis, no calf tenderness or swelling, patient has area of erythema and warmth noted to the lateral right leg that does not cross over his joint line of his knee, there is a small scabbed lesion but no abscess or fluctuance, no drainage, no joint effusion, full range of motion in the right knee, 2+ DP pulses bilaterally SKIN: Normal color for age and race; warm; no rash NEURO: Moves all extremities equally PSYCH: The patient's mood and manner are appropriate. Grooming and personal hygiene are appropriate.  MEDICAL DECISION MAKING: Patient here with cellulitis.  Likely from recent dog bite.  Initially was tachycardic but this has resolved.  No fever.  Does have a leukocytosis of 17,000 but otherwise labs unremarkable including normal lactate.  We have sent blood cultures.  Chest x-ray, urine obtained that showed no sign of infection.  I suspect that he is feeling ill because of the cellulitis.  We have updated his tetanus vaccination.  I have offered him admission but patient states he is feeling so much better he would like to go home.  I feel this is reasonable.  We have outlined the area of redness with a tissue marker and will discharge with Augmentin and Bactrim.  Have also recommended probiotics while on these antibiotics.  He does have a PCP for close follow-up.  Patient and family seem very reliable and will return if he has any worsening symptoms or spread of cellulitis.  Discussed with  him that we will contact him if his blood cultures are positive.  Of low suspicion for bacteremia at this time.  He does not appear septic.  Recommended Tylenol for pain control and fever control at home.  He declines any further pain medication.  No abscess on examination.  No sign of necrotizing fasciitis.    At this time, I do not feel there is any life-threatening condition present. I have reviewed and discussed all results (EKG, imaging, lab, urine as appropriate) and exam findings with patient/family. I have reviewed nursing notes and appropriate previous records.  I feel the patient is safe to be discharged home without further emergent workup and can continue workup as an outpatient as needed. Discussed usual and customary return precautions. Patient/family verbalize understanding  and are comfortable with this plan.  Outpatient follow-up has been provided if needed. All questions have been answered.      Ward, Layla Maw, DO 01/26/18 0236

## 2018-01-31 LAB — CULTURE, BLOOD (ROUTINE X 2)
Culture: NO GROWTH
Culture: NO GROWTH
Special Requests: ADEQUATE
Special Requests: ADEQUATE

## 2018-11-07 IMAGING — DX DG CHEST 2V
2 series · 2 of 2 positions shown · non-contrast
Comparison: Chest radiograph July 26, 2012

CLINICAL DATA: Nausea and syncope.  Status post dog bite.

EXAM:
CHEST - 2 VIEW

[chest pa]
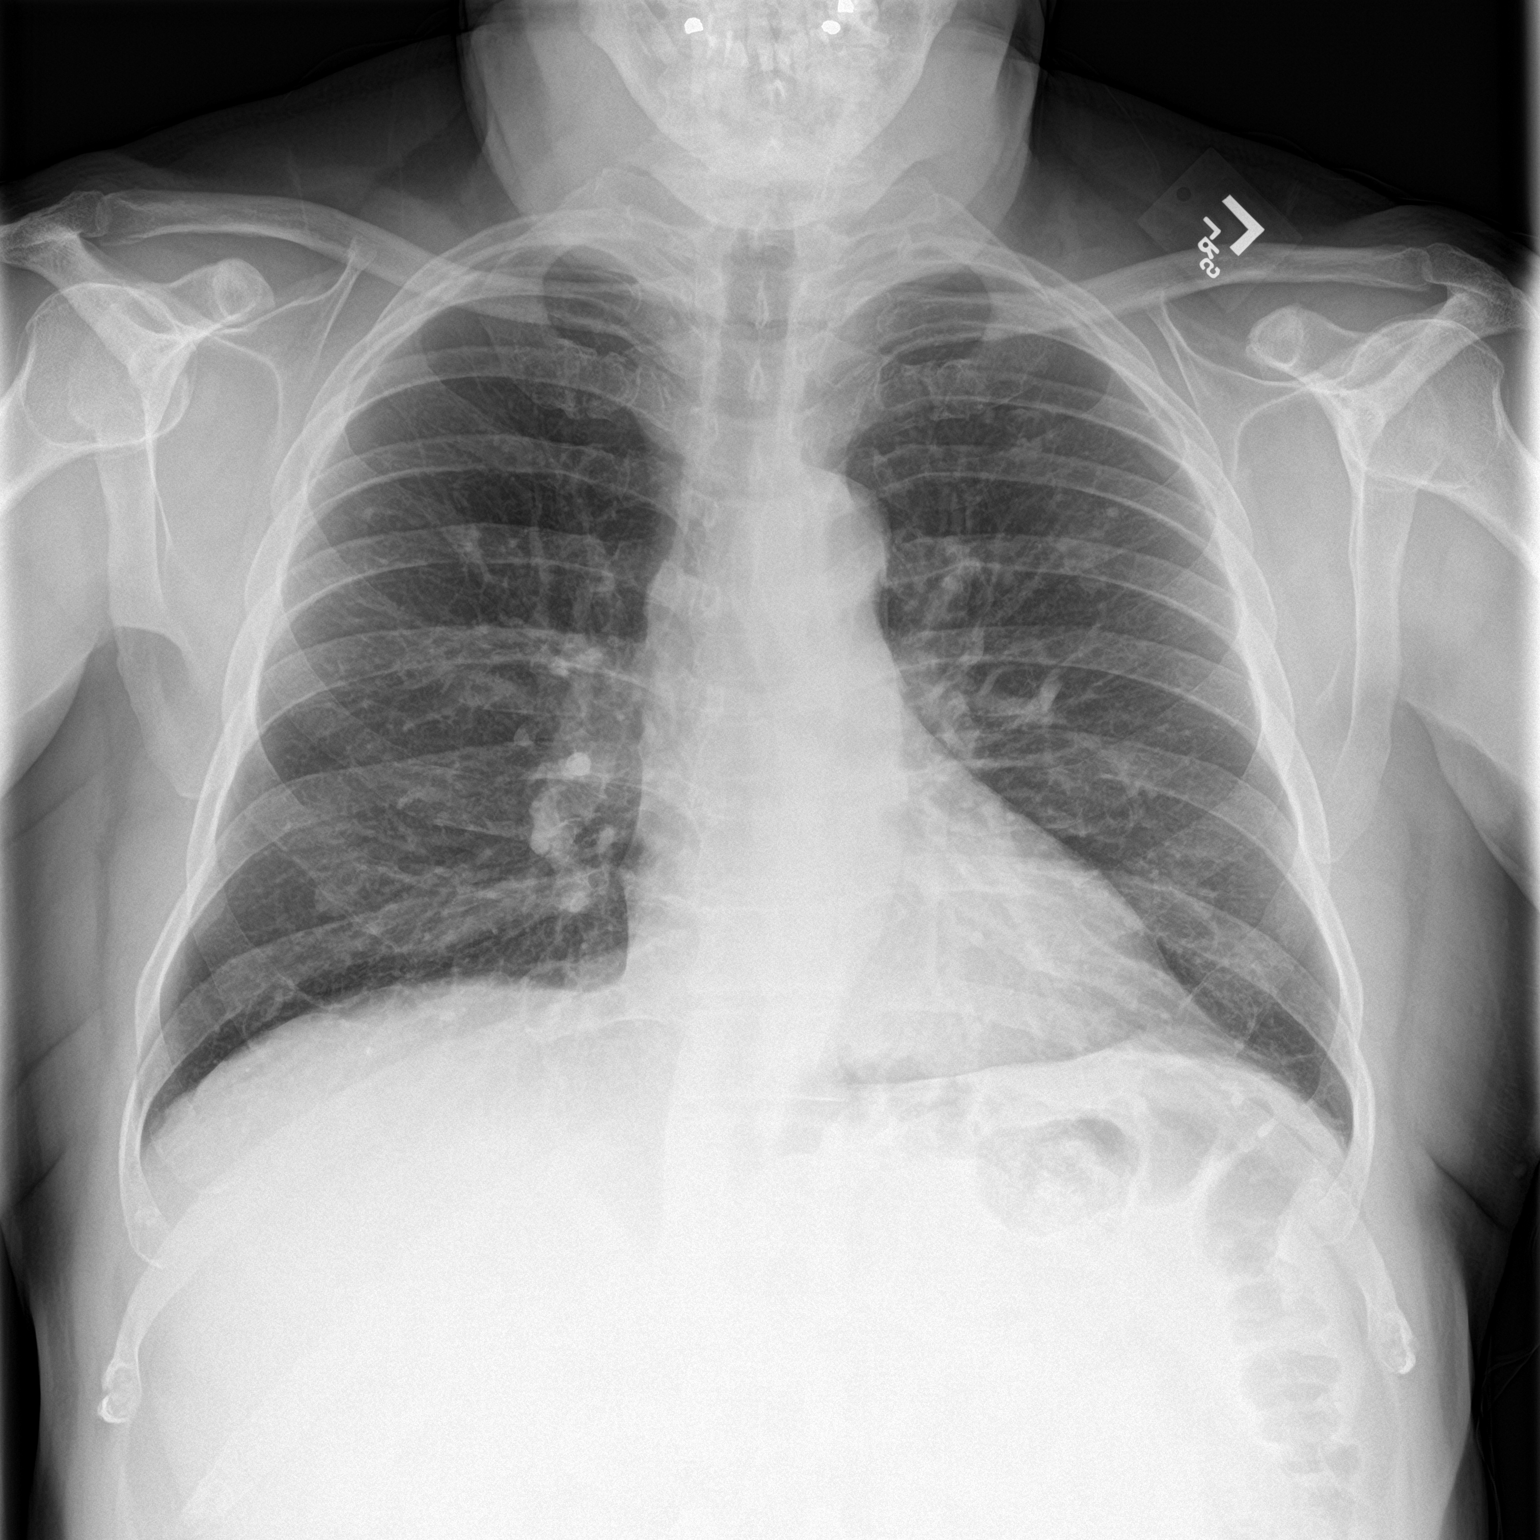

[chest lat]
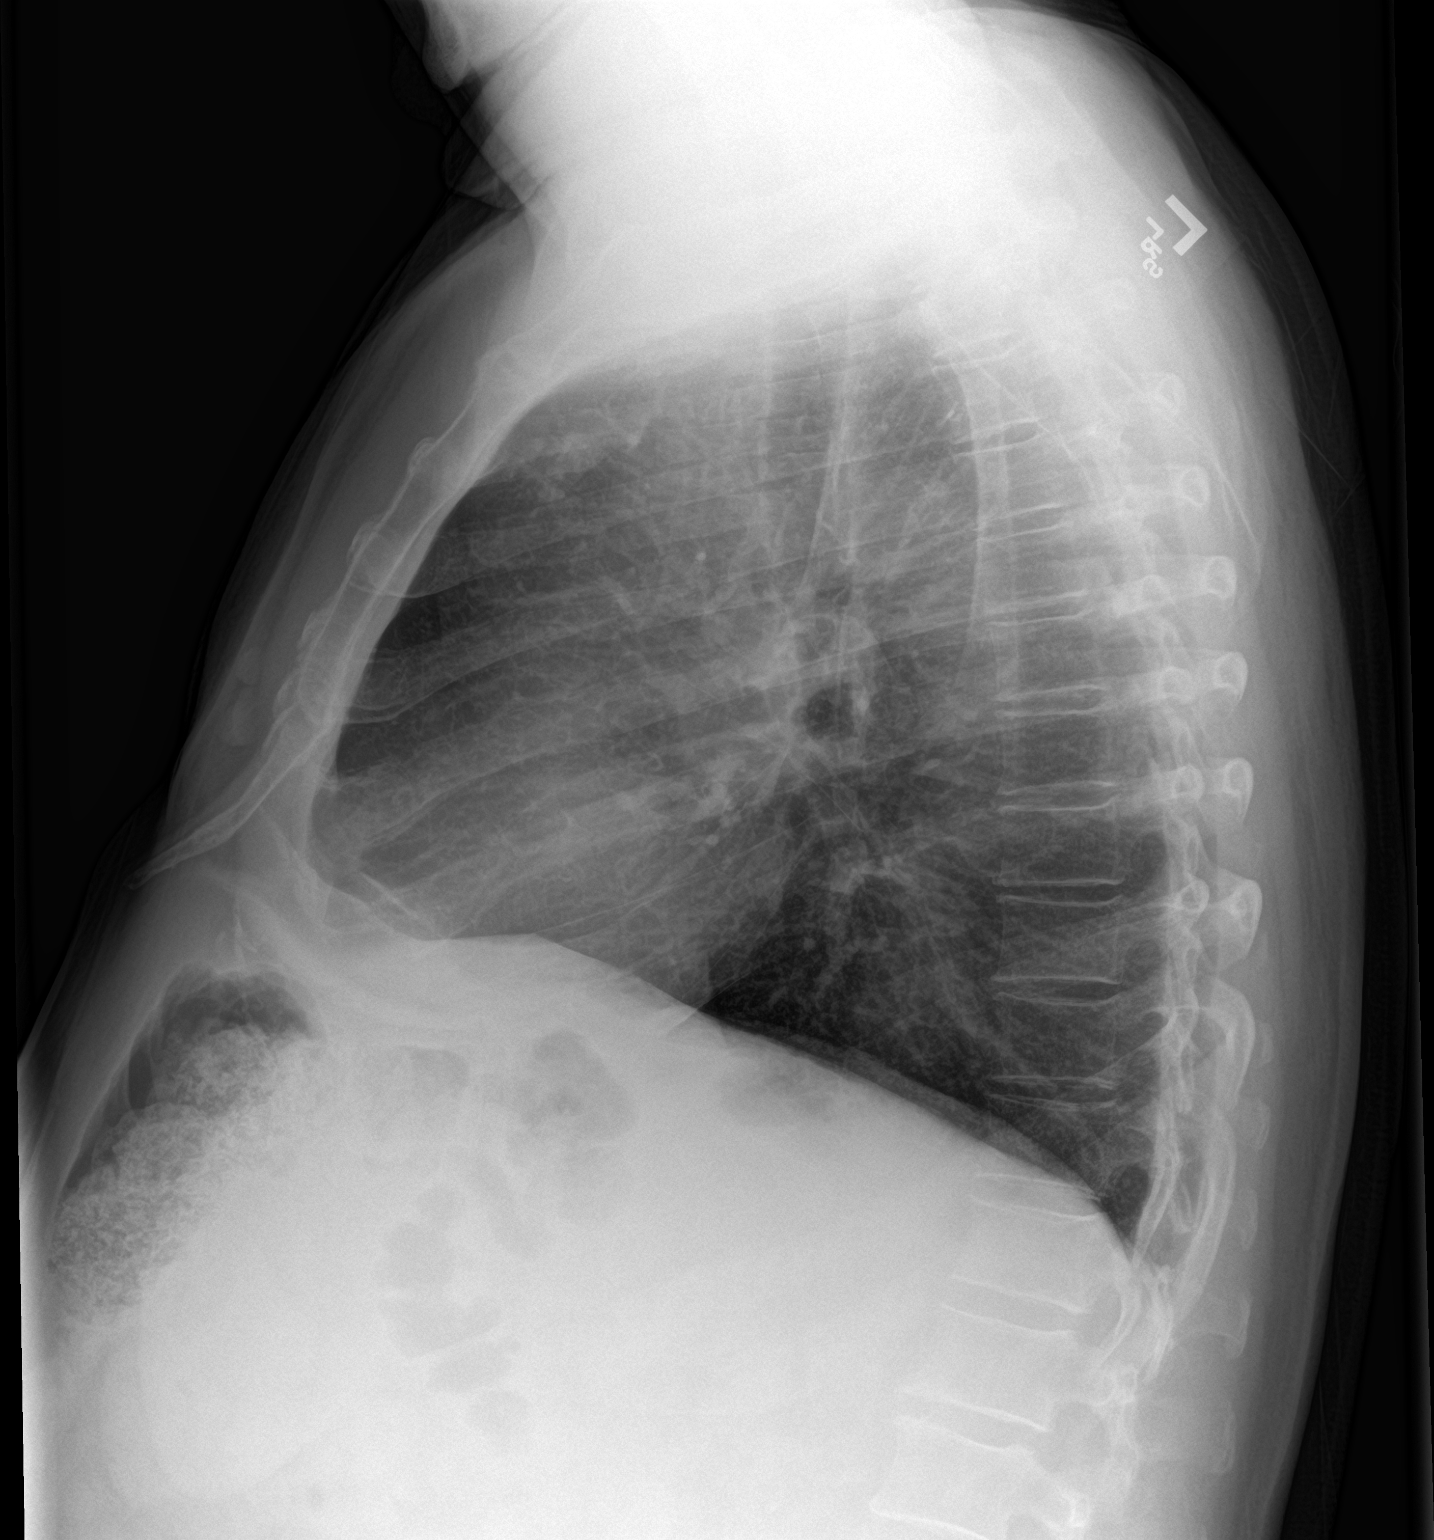

[2 of 2 positions shown; findings below may reference images not displayed]

FINDINGS: Cardiomediastinal silhouette is normal. No pleural effusions or
focal consolidations. Hyperinflation. Strandy densities LEFT lung
base. Trachea projects midline and there is no pneumothorax. Soft
tissue planes and included osseous structures are non-suspicious.
IMPRESSION: Hyperinflation with LEFT lung base atelectasis/scarring.

## 2020-03-26 ENCOUNTER — Other Ambulatory Visit: Payer: Self-pay

## 2020-03-26 ENCOUNTER — Ambulatory Visit (INDEPENDENT_AMBULATORY_CARE_PROVIDER_SITE_OTHER): Payer: Medicare PPO | Admitting: Ophthalmology

## 2020-03-26 ENCOUNTER — Encounter (INDEPENDENT_AMBULATORY_CARE_PROVIDER_SITE_OTHER): Payer: Self-pay | Admitting: Ophthalmology

## 2020-03-26 DIAGNOSIS — H353131 Nonexudative age-related macular degeneration, bilateral, early dry stage: Secondary | ICD-10-CM

## 2020-03-26 DIAGNOSIS — E119 Type 2 diabetes mellitus without complications: Secondary | ICD-10-CM | POA: Diagnosis not present

## 2020-03-26 DIAGNOSIS — H2513 Age-related nuclear cataract, bilateral: Secondary | ICD-10-CM | POA: Diagnosis not present

## 2020-03-26 HISTORY — DX: Age-related nuclear cataract, bilateral: H25.13

## 2020-03-26 NOTE — Progress Notes (Signed)
03/26/2020     CHIEF COMPLAINT Patient presents for Diabetic Eye Exam   HISTORY OF PRESENT ILLNESS: [name] is a 79 y.o. male who presents to the clinic today for:   HPI    Diabetic Eye Exam    Vision is blurred for near.  Associated Symptoms Floaters.  Negative for Flashes.  Diabetes characteristics include Type 2.  Blood sugar level is controlled.  Last A1C 6.5.  I, the attending physician,  performed the HPI with the patient and updated documentation appropriately.          Comments    3 Year Diabetic Exam OU. OCT  Pt c/o DVA being blurry. Pt states he has a hard time making out details. Pt wonders if it is time to have cataract taken out. BGL: did not check       Last edited by Edmon Crape, MD on 03/26/2020  9:02 AM. (History)      Referring physician: Georgann Housekeeper, MD [address] [place],  [place] [address]  HISTORICAL INFORMATION:   Selected notes from the MEDICAL RECORD NUMBER       CURRENT MEDICATIONS: No current outpatient medications on file. (Ophthalmic Drugs)   No current facility-administered medications for this visit. (Ophthalmic Drugs)   Current Outpatient Medications (Other)  Medication Sig  . acetaminophen (TYLENOL) 650 MG CR tablet Take 1,300 mg by mouth daily.  Marland Kitchen amoxicillin-clavulanate (AUGMENTIN) 875-125 MG tablet Take 1 tablet by mouth every 12 (twelve) hours.  . cyclobenzaprine (FLEXERIL) 5 MG tablet Take 1 tablet (5 mg total) by mouth 3 (three) times daily as needed for muscle spasms.  . ferrous sulfate 325 (65 FE) MG tablet Take 1 tablet (325 mg total) by mouth 3 (three) times daily after meals.  Marland Kitchen glimepiride (AMARYL) 2 MG tablet Take 1 mg by mouth every morning.  Marland Kitchen HYDROmorphone (DILAUDID) 2 MG tablet Take 1 tablet (2 mg total) by mouth every 3 (three) hours as needed for severe pain (Q4-6 hours PRN).  Marland Kitchen losartan (COZAAR) 100 MG tablet Take 100 mg by mouth every morning.   . metFORMIN (GLUCOPHAGE) 1000 MG tablet  Take 1,000 mg by mouth 2 (two) times daily with a meal.  . Probiotic CAPS Take 1 capsule by mouth daily.  . rivaroxaban (XARELTO) 10 MG TABS tablet Take 1 tablet (10 mg total) by mouth daily with breakfast.  . simvastatin (ZOCOR) 20 MG tablet Take 20 mg by mouth every evening.  . traMADol (ULTRAM) 50 MG tablet Take 50 mg by mouth every 4 (four) hours as needed for moderate pain.   No current facility-administered medications for this visit. (Other)   Facility-Administered Medications Ordered in Other Visits (Other)  Medication Route  . dexamethasone (DECADRON) injection 10 mg Intravenous      REVIEW OF SYSTEMS:    ALLERGIES Allergies  Allergen Reactions  . Latex Dermatitis    PAST MEDICAL HISTORY Past Medical History:  Diagnosis Date  . Arthritis THUMB  . Diabetes mellitus, type 2 (HCC)    HAS "RAPID DROPS IN BLOOD SUGAR"  . Hyperlipidemia   . Hypertension   . Psoriasis    Past Surgical History:  Procedure Laterality Date  . COLONOSCOPY WITH PROPOFOL N/A 05/19/2015   Procedure: COLONOSCOPY WITH PROPOFOL;  Surgeon: Charolett Bumpers, MD;  Location: [facility];  Service: Endoscopy;  Laterality: N/A;  . HAND SURGERY Bilateral    Bilateral thumbs-"tendon repair"  . HERNIA REPAIR     Hiatal hernia  .  KNEE ARTHROSCOPY  1988 (APPROX)   RIGHT KNEE  . TONSILLECTOMY    . 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 TOTAL KNEE ARTHROPLASTY Left 07/31/2014   Procedure: TOTAL LEFT KNEE ARTHROPLASTY;  Surgeon: Tobi Bastos, MD;  Location: [facility];  Service: Orthopedics;  Laterality: Left;  Marland Kitchen VASECTOMY      FAMILY HISTORY Family History  Problem Relation Age of Onset  . Diabetes Mother   . Diabetes Sister   . Diabetes Brother     SOCIAL HISTORY Social History   Tobacco Use  . Smoking status: Former Smoker    Packs/day: 1.00     Years: 10.00    Pack years: 10.00    Quit date: 07/27/1979    Years since quitting: 40.6  . Smokeless tobacco: Never Used  Substance Use Topics  . Alcohol use: No  . Drug use: No         OPHTHALMIC EXAM:  Base Eye Exam    Visual Acuity (Snellen - Linear)      Right Left   Dist cc 20/80 -1 20/25 -2   Dist ph cc 20/60 -1    Correction: Glasses       Tonometry (Tonopen, 8:32 AM)      Right Left   Pressure 17 17       Pupils      Pupils Dark Light Shape React APD   Right PERRL 5 4 Round Brisk None   Left PERRL 5 4 Round Brisk None       Visual Fields (Counting fingers)      Left Right    Full Full       Neuro/Psych    Oriented x3: Yes   Mood/Affect: Normal       Dilation    Both eyes: 1.0% Mydriacyl, 2.5% Phenylephrine @ 8:32 AM        Slit Lamp and Fundus Exam    External Exam      Right Left   External Normal Normal       Slit Lamp Exam      Right Left   Lids/Lashes Normal Normal   Conjunctiva/Sclera White and quiet White and quiet   Cornea Clear Clear   Anterior Chamber Deep and quiet Deep and quiet   Iris Round and reactive Round and reactive   Lens 3+ Nuclear sclerosis 3+ Nuclear sclerosis   Anterior Vitreous Normal Normal       Fundus Exam      Right Left   Posterior Vitreous Normal Normal   Disc Normal Normal   C/D Ratio 0.5 0.5   Macula Normal Normal   Vessels Normal,, Normal,,   Periphery Normal Normal          IMAGING AND PROCEDURES  Imaging and Procedures for 03/26/20  OCT, Retina - OU - Both Eyes       Right Eye Quality was good. Scan locations included subfoveal. Central Foveal Thickness: 328. Progression has been stable. Findings include vitreomacular adhesion .   Left Eye Quality was good. Scan locations included subfoveal. Central Foveal Thickness: 309. Progression has been stable.   Notes OD with minor vitreal macular adhesion, with minimal thickening to the center of the fovea which I have associated with the  occurrence of sleep apnea.  Upon review of this finding I asked the patient and indeed he does  have sleep apnea, is unable to tolerate the mask  OS normal                ASSESSMENT/PLAN:  Nuclear sclerotic cataract of both eyes The nature of cataract was discussed with the patient as well as the elective nature of surgery. The patient was reassured that surgery at a later date does not put the patient at risk for a worse outcome. It was emphasized that the need for surgery is dictated by the patient's quality of life as influenced by the cataract. Patient was instructed to maintain close follow up with their general eye care doctor.  Cataract(s) account for the patient's complaint. I discussed the risks and benefits of cataract surgery. Options were explained to the patient. The patient understands that new glasses may not improve their vision and desires to have cataract surgery. I have recommended follow up with their general eye care doctor for evaluation and consideration of cataract extraction with new intraocular lens insertion. Will refer for Cataract evaluation - [place]   Diabetes mellitus without complication (HCC) The patient has diabetes without any evidence of retinopathy. The patient advised to maintain good blood glucose control, excellent blood pressure control, and favorable levels of cholesterol, low density lipoprotein, and high density lipoproteins. Follow up in 1 year was recommended. Explained that fluctuations in visual acuity , or "out of focus", may result from large variations of blood sugar control.      ICD-10-CM   1. Diabetes mellitus without complication (HCC)  E11.9 OCT, Retina - OU - Both Eyes  2. Early stage nonexudative age-related macular degeneration of both eyes  H35.3131   3. Nuclear sclerotic cataract of both eyes  H25.13     1.  2. Minor foveal thickening in the substance of the retina OD.OD with minor vitreal macular adhesion, with minimal  thickening to the center of the fovea which I have associated with the occurrence of sleep apnea.  Upon review of this finding I asked the patient and indeed he does have sleep apnea, is unable to tolerate the mask. He does sleep most of the night however on his side and awakens very few times.  3. OU, with blurred vision OD worse. Patient does require cataract evaluation and we will arrange for consultation  Ophthalmic Meds Ordered this visit:  No orders of the defined types were placed in this encounter.      Return in about 1 year (around 03/26/2021) for DILATE OU, OCT.  There are no Patient Instructions on file for this visit.   Explained the diagnoses, plan, and follow up with the patient and they expressed understanding.  Patient expressed understanding of the importance of proper follow up care.   Alford Highland Daemien Fronczak M.D. Diseases & Surgery of the Retina and Vitreous [facility] 03/26/20    Will  Abbreviations: M myopia (nearsighted); A astigmatism; H hyperopia (farsighted); P presbyopia; Mrx spectacle prescription;  CTL contact lenses; OD right eye; OS left eye; OU both eyes  XT exotropia; ET esotropia; PEK punctate epithelial keratitis; PEE punctate epithelial erosions; DES dry eye syndrome; MGD meibomian gland dysfunction; ATs artificial tears; PFAT's preservative free artificial tears; NSC nuclear sclerotic cataract; PSC posterior subcapsular cataract; ERM epi-retinal membrane; PVD posterior vitreous detachment; RD retinal detachment; DM diabetes mellitus; DR diabetic retinopathy; NPDR non-proliferative diabetic retinopathy; PDR proliferative diabetic retinopathy; CSME clinically significant macular edema; DME diabetic macular edema; dbh dot blot hemorrhages; CWS cotton wool spot; POAG primary open angle glaucoma;  C/D cup-to-disc ratio; HVF humphrey visual field; GVF goldmann visual field; OCT optical coherence tomography; IOP intraocular pressure; BRVO Branch  retinal vein occlusion; CRVO central retinal vein occlusion; CRAO central retinal artery occlusion; BRAO branch retinal artery occlusion; RT retinal tear; SB scleral buckle; PPV pars plana vitrectomy; VH Vitreous hemorrhage; PRP panretinal laser photocoagulation; IVK intravitreal kenalog; VMT vitreomacular traction; MH Macular hole;  NVD neovascularization of the disc; NVE neovascularization elsewhere; AREDS age related eye disease study; ARMD age related macular degeneration; POAG primary open angle glaucoma; EBMD epithelial/anterior basement membrane dystrophy; ACIOL anterior chamber intraocular lens; IOL intraocular lens; PCIOL posterior chamber intraocular lens; Phaco/IOL phacoemulsification with intraocular lens placement; Sharon photorefractive keratectomy; LASIK laser assisted in situ keratomileusis; HTN hypertension; DM diabetes mellitus; COPD chronic obstructive pulmonary disease

## 2020-03-26 NOTE — Assessment & Plan Note (Addendum)
The nature of cataract was discussed with the patient as well as the elective nature of surgery. The patient was reassured that surgery at a later date does not put the patient at risk for a worse outcome. It was emphasized that the need for surgery is dictated by the patient's quality of life as influenced by the cataract. Patient was instructed to maintain close follow up with their general eye care doctor.  Cataract(s) account for the patient's complaint. I discussed the risks and benefits of cataract surgery. Options were explained to the patient. The patient understands that new glasses may not improve their vision and desires to have cataract surgery. I have recommended follow up with their general eye care doctor for evaluation and consideration of cataract extraction with new intraocular lens insertion. Will refer for Cataract evaluation - [place]

## 2020-03-26 NOTE — Assessment & Plan Note (Signed)

## 2020-04-10 ENCOUNTER — Ambulatory Visit (INDEPENDENT_AMBULATORY_CARE_PROVIDER_SITE_OTHER): Payer: Medicare PPO | Admitting: Ophthalmology

## 2020-04-10 ENCOUNTER — Encounter (INDEPENDENT_AMBULATORY_CARE_PROVIDER_SITE_OTHER): Payer: Self-pay | Admitting: Ophthalmology

## 2020-04-10 ENCOUNTER — Other Ambulatory Visit: Payer: Self-pay

## 2020-04-10 DIAGNOSIS — H353131 Nonexudative age-related macular degeneration, bilateral, early dry stage: Secondary | ICD-10-CM

## 2020-04-10 NOTE — Progress Notes (Signed)
04/10/2020     CHIEF COMPLAINT Patient presents for Retina Follow Up   HISTORY OF PRESENT ILLNESS: [name] is a 79 y.o. male who presents to the clinic today for:   HPI    Retina Follow Up    Patient presents with  Other.  In both eyes.  Duration of 3 weeks.  Since onset it is stable.          Comments    3 week follow up - OCT OU Patient states he has worn his CPAP mask for 3 weeks and is ready to have another OCT to help with Dr. Radene Ou report.       Last edited by Gerda Diss on 04/10/2020  2:29 PM. (History)      Referring physician: Wenda Low, MD [place],  [place] [address]  HISTORICAL INFORMATION:   Selected notes from the [facility]: No current outpatient medications on file. (Ophthalmic Drugs)   No current facility-administered medications for this visit. (Ophthalmic Drugs)   Current Outpatient Medications (Other)  Medication Sig  . acetaminophen (TYLENOL) 650 MG CR tablet Take 1,300 mg by mouth daily.  Marland Kitchen amoxicillin-clavulanate (AUGMENTIN) 875-125 MG tablet Take 1 tablet by mouth every 12 (twelve) hours.  . cyclobenzaprine (FLEXERIL) 5 MG tablet Take 1 tablet (5 mg total) by mouth 3 (three) times daily as needed for muscle spasms.  . ferrous sulfate 325 (65 FE) MG tablet Take 1 tablet (325 mg total) by mouth 3 (three) times daily after meals.  Marland Kitchen glimepiride (AMARYL) 2 MG tablet Take 1 mg by mouth every morning.  Marland Kitchen HYDROmorphone (DILAUDID) 2 MG tablet Take 1 tablet (2 mg total) by mouth every 3 (three) hours as needed for severe pain (Q4-6 hours PRN).  Marland Kitchen losartan (COZAAR) 100 MG tablet Take 100 mg by mouth every morning.   . metFORMIN (GLUCOPHAGE) 1000 MG tablet Take 1,000 mg by mouth 2 (two) times daily with a meal.  . Probiotic CAPS Take 1 capsule by mouth daily.  . rivaroxaban (XARELTO) 10 MG TABS tablet Take 1 tablet (10 mg total) by mouth daily with breakfast.  . simvastatin  (ZOCOR) 20 MG tablet Take 20 mg by mouth every evening.  . traMADol (ULTRAM) 50 MG tablet Take 50 mg by mouth every 4 (four) hours as needed for moderate pain.   No current facility-administered medications for this visit. (Other)   Facility-Administered Medications Ordered in Other Visits (Other)  Medication Route  . dexamethasone (DECADRON) injection 10 mg Intravenous      REVIEW OF SYSTEMS:    ALLERGIES Allergies  Allergen Reactions  . Latex Dermatitis    PAST MEDICAL HISTORY Past Medical History:  Diagnosis Date  . Arthritis THUMB  . Diabetes mellitus, type 2 ([place])    HAS "RAPID DROPS IN BLOOD SUGAR"  . Hyperlipidemia   . Hypertension   . Psoriasis    Past Surgical History:  Procedure Laterality Date  . COLONOSCOPY WITH PROPOFOL N/A 05/19/2015   Procedure: COLONOSCOPY WITH PROPOFOL;  Surgeon: Garlan Fair, MD;  Location: [facility];  Service: Endoscopy;  Laterality: N/A;  . HAND SURGERY Bilateral    Bilateral thumbs-"tendon repair"  . HERNIA REPAIR     Hiatal hernia  . KNEE ARTHROSCOPY  1988 (APPROX)   RIGHT KNEE  . TONSILLECTOMY    . 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 TOTAL KNEE ARTHROPLASTY Left 07/31/2014   Procedure: TOTAL LEFT KNEE ARTHROPLASTY;  Surgeon: Jacki Cones, MD;  Location: [facility];  Service: Orthopedics;  Laterality: Left;  Marland Kitchen VASECTOMY      FAMILY HISTORY Family History  Problem Relation Age of Onset  . Diabetes Mother   . Diabetes Sister   . Diabetes Brother     SOCIAL HISTORY Social History   Tobacco Use  . Smoking status: Former Smoker    Packs/day: 1.00    Years: 10.00    Pack years: 10.00    Quit date: 07/27/1979    Years since quitting: 40.7  . Smokeless tobacco: Never Used  Substance Use Topics  . Alcohol use: No  . Drug use: No         OPHTHALMIC EXAM: Base Eye  Exam    Visual Acuity (Snellen - Linear)      Right Left   Dist [place] 20/70 20/20-2   Dist ph [place] NI        Tonometry (Tonopen, 2:33 PM)      Right Left   Pressure 18 17       Neuro/Psych    Oriented x3: Yes   Mood/Affect: Normal        Slit Lamp and Fundus Exam    External Exam      Right Left   External Normal Normal       Slit Lamp Exam      Right Left   Lids/Lashes Normal Normal   Conjunctiva/Sclera White and quiet White and quiet   Cornea Clear Clear   Anterior Chamber Deep and quiet Deep and quiet   Iris Round and reactive Round and reactive   Vitreous Normal Normal          IMAGING AND PROCEDURES  Imaging and Procedures for 04/10/20  OCT, Retina - OU - Both Eyes       Right Eye Quality was good. Scan locations included subfoveal. Central Foveal Thickness: 328. Progression has been stable.   Left Eye Quality was good. Scan locations included subfoveal. Progression has been stable.   Notes OCT, stable OU after weeks post resumption of CPAP use daily weeks                ASSESSMENT/PLAN:  No problem-specific Assessment & Plan notes found for this encounter.      ICD-10-CM   1. Early stage nonexudative age-related macular degeneration of both eyes  H35.3131 OCT, Retina - OU - Both Eyes    1.  2.  3.  Ophthalmic Meds Ordered this visit:  No orders of the defined types were placed in this encounter.      No follow-ups on file.  There are no Patient Instructions on file for this visit.   Explained the diagnoses, plan, and follow up with the patient and they expressed understanding.  Patient expressed understanding of the importance of proper follow up care.   [name] M.D. Diseases & Surgery of the Retina and Vitreous [facility] 04/10/20     Abbreviations: M myopia (nearsighted); A astigmatism; H hyperopia (farsighted); P presbyopia; Mrx spectacle prescription;  CTL contact lenses; OD right eye; OS  left eye; OU both eyes  XT exotropia; ET esotropia; PEK punctate epithelial keratitis; PEE punctate epithelial erosions; DES dry eye syndrome; MGD meibomian gland dysfunction; ATs artificial tears; PFAT's preservative free artificial tears; NSC nuclear  sclerotic cataract; PSC posterior subcapsular cataract; ERM epi-retinal membrane; PVD posterior vitreous detachment; RD retinal detachment; DM diabetes mellitus; DR diabetic retinopathy; NPDR non-proliferative diabetic retinopathy; PDR proliferative diabetic retinopathy; CSME clinically significant macular edema; DME diabetic macular edema; dbh dot blot hemorrhages; CWS cotton wool spot; POAG primary open angle glaucoma; C/D cup-to-disc ratio; HVF humphrey visual field; GVF goldmann visual field; OCT optical coherence tomography; IOP intraocular pressure; BRVO Branch retinal vein occlusion; CRVO central retinal vein occlusion; CRAO central retinal artery occlusion; BRAO branch retinal artery occlusion; RT retinal tear; SB scleral buckle; PPV pars plana vitrectomy; VH Vitreous hemorrhage; PRP panretinal laser photocoagulation; IVK intravitreal kenalog; VMT vitreomacular traction; MH Macular hole;  NVD neovascularization of the disc; NVE neovascularization elsewhere; AREDS age related eye disease study; ARMD age related macular degeneration; POAG primary open angle glaucoma; EBMD epithelial/anterior basement membrane dystrophy; ACIOL anterior chamber intraocular lens; IOL intraocular lens; PCIOL posterior chamber intraocular lens; Phaco/IOL phacoemulsification with intraocular lens placement; [place] photorefractive keratectomy; LASIK laser assisted in situ keratomileusis; HTN hypertension; DM diabetes mellitus; COPD chronic obstructive pulmonary disease

## 2020-04-15 ENCOUNTER — Encounter (INDEPENDENT_AMBULATORY_CARE_PROVIDER_SITE_OTHER): Payer: Medicare PPO | Admitting: Ophthalmology

## 2020-04-29 DIAGNOSIS — H25813 Combined forms of age-related cataract, bilateral: Secondary | ICD-10-CM | POA: Diagnosis not present

## 2020-04-29 DIAGNOSIS — H53031 Strabismic amblyopia, right eye: Secondary | ICD-10-CM | POA: Diagnosis not present

## 2020-04-29 DIAGNOSIS — E119 Type 2 diabetes mellitus without complications: Secondary | ICD-10-CM | POA: Diagnosis not present

## 2020-06-06 DIAGNOSIS — H25812 Combined forms of age-related cataract, left eye: Secondary | ICD-10-CM | POA: Diagnosis not present

## 2020-06-18 DIAGNOSIS — H2511 Age-related nuclear cataract, right eye: Secondary | ICD-10-CM | POA: Diagnosis not present

## 2020-06-20 DIAGNOSIS — H25811 Combined forms of age-related cataract, right eye: Secondary | ICD-10-CM | POA: Diagnosis not present

## 2020-08-25 DIAGNOSIS — I10 Essential (primary) hypertension: Secondary | ICD-10-CM | POA: Diagnosis not present

## 2020-08-25 DIAGNOSIS — E782 Mixed hyperlipidemia: Secondary | ICD-10-CM | POA: Diagnosis not present

## 2020-08-25 DIAGNOSIS — G4733 Obstructive sleep apnea (adult) (pediatric): Secondary | ICD-10-CM | POA: Diagnosis not present

## 2020-08-25 DIAGNOSIS — Z7984 Long term (current) use of oral hypoglycemic drugs: Secondary | ICD-10-CM | POA: Diagnosis not present

## 2020-08-25 DIAGNOSIS — E1169 Type 2 diabetes mellitus with other specified complication: Secondary | ICD-10-CM | POA: Diagnosis not present

## 2020-08-25 DIAGNOSIS — N400 Enlarged prostate without lower urinary tract symptoms: Secondary | ICD-10-CM | POA: Diagnosis not present

## 2020-08-25 DIAGNOSIS — I350 Nonrheumatic aortic (valve) stenosis: Secondary | ICD-10-CM | POA: Diagnosis not present

## 2020-10-14 DIAGNOSIS — E119 Type 2 diabetes mellitus without complications: Secondary | ICD-10-CM | POA: Diagnosis not present

## 2020-10-14 DIAGNOSIS — H43811 Vitreous degeneration, right eye: Secondary | ICD-10-CM | POA: Diagnosis not present

## 2020-10-14 DIAGNOSIS — Z961 Presence of intraocular lens: Secondary | ICD-10-CM | POA: Diagnosis not present

## 2021-03-02 DIAGNOSIS — D225 Melanocytic nevi of trunk: Secondary | ICD-10-CM | POA: Diagnosis not present

## 2021-03-02 DIAGNOSIS — D485 Neoplasm of uncertain behavior of skin: Secondary | ICD-10-CM | POA: Diagnosis not present

## 2021-03-02 DIAGNOSIS — L400 Psoriasis vulgaris: Secondary | ICD-10-CM | POA: Diagnosis not present

## 2021-03-02 DIAGNOSIS — L570 Actinic keratosis: Secondary | ICD-10-CM | POA: Diagnosis not present

## 2021-03-02 DIAGNOSIS — L814 Other melanin hyperpigmentation: Secondary | ICD-10-CM | POA: Diagnosis not present

## 2021-03-26 ENCOUNTER — Encounter (INDEPENDENT_AMBULATORY_CARE_PROVIDER_SITE_OTHER): Payer: Medicare PPO | Admitting: Ophthalmology

## 2021-04-02 DIAGNOSIS — Z0000 Encounter for general adult medical examination without abnormal findings: Secondary | ICD-10-CM | POA: Diagnosis not present

## 2021-04-02 DIAGNOSIS — L409 Psoriasis, unspecified: Secondary | ICD-10-CM | POA: Diagnosis not present

## 2021-04-02 DIAGNOSIS — G4733 Obstructive sleep apnea (adult) (pediatric): Secondary | ICD-10-CM | POA: Diagnosis not present

## 2021-04-02 DIAGNOSIS — I350 Nonrheumatic aortic (valve) stenosis: Secondary | ICD-10-CM | POA: Diagnosis not present

## 2021-04-02 DIAGNOSIS — Z7984 Long term (current) use of oral hypoglycemic drugs: Secondary | ICD-10-CM | POA: Diagnosis not present

## 2021-04-02 DIAGNOSIS — E1169 Type 2 diabetes mellitus with other specified complication: Secondary | ICD-10-CM | POA: Diagnosis not present

## 2021-04-02 DIAGNOSIS — M25561 Pain in right knee: Secondary | ICD-10-CM | POA: Diagnosis not present

## 2021-04-02 DIAGNOSIS — E782 Mixed hyperlipidemia: Secondary | ICD-10-CM | POA: Diagnosis not present

## 2021-04-02 DIAGNOSIS — M1990 Unspecified osteoarthritis, unspecified site: Secondary | ICD-10-CM | POA: Diagnosis not present

## 2021-04-02 DIAGNOSIS — N400 Enlarged prostate without lower urinary tract symptoms: Secondary | ICD-10-CM | POA: Diagnosis not present

## 2021-04-02 DIAGNOSIS — I10 Essential (primary) hypertension: Secondary | ICD-10-CM | POA: Diagnosis not present

## 2021-04-10 DIAGNOSIS — I350 Nonrheumatic aortic (valve) stenosis: Secondary | ICD-10-CM | POA: Diagnosis not present

## 2021-04-13 ENCOUNTER — Encounter (INDEPENDENT_AMBULATORY_CARE_PROVIDER_SITE_OTHER): Payer: Self-pay | Admitting: Ophthalmology

## 2021-04-13 ENCOUNTER — Ambulatory Visit (INDEPENDENT_AMBULATORY_CARE_PROVIDER_SITE_OTHER): Payer: Medicare PPO | Admitting: Ophthalmology

## 2021-04-13 ENCOUNTER — Other Ambulatory Visit: Payer: Self-pay

## 2021-04-13 DIAGNOSIS — E119 Type 2 diabetes mellitus without complications: Secondary | ICD-10-CM

## 2021-04-13 DIAGNOSIS — H353131 Nonexudative age-related macular degeneration, bilateral, early dry stage: Secondary | ICD-10-CM

## 2021-04-13 DIAGNOSIS — Z961 Presence of intraocular lens: Secondary | ICD-10-CM | POA: Diagnosis not present

## 2021-04-13 DIAGNOSIS — G4733 Obstructive sleep apnea (adult) (pediatric): Secondary | ICD-10-CM | POA: Insufficient documentation

## 2021-04-13 NOTE — Assessment & Plan Note (Signed)
Minor OU, no high risk features

## 2021-04-13 NOTE — Assessment & Plan Note (Signed)
Patient reports compliance with CPAP now for the last year.  Does not like the mouth dryness nonetheless probably feels a little bit better overall with its use daily or night

## 2021-04-13 NOTE — Progress Notes (Signed)
04/13/2021     CHIEF COMPLAINT Patient presents for Retina Follow Up (1 Year F/U OU//Pt sts [facility] improved OU s/p cataract sx OU. No new symptoms reported OU.)   HISTORY OF PRESENT ILLNESS: [name] is a 80 y.o. male who presents to the clinic today for:   HPI     Retina Follow Up           Diagnosis: Dry AMD   Laterality: both eyes   Onset: 1 year ago   Severity: mild   Duration: 1 year   Course: stable   Comments: 1 Year F/U OU  Pt sts VA improved OU s/p cataract sx OU. No new symptoms reported OU.       Last edited by Ileana Roup, COA on 04/13/2021  2:30 PM.      Referring physician: Georgann Housekeeper, MD [address] [place],  [place] [address]  HISTORICAL INFORMATION:   Selected notes from the MEDICAL RECORD NUMBER       CURRENT MEDICATIONS: No current outpatient medications on file. (Ophthalmic Drugs)   No current facility-administered medications for this visit. (Ophthalmic Drugs)   Current Outpatient Medications (Other)  Medication Sig   acetaminophen (TYLENOL) 650 MG CR tablet Take 1,300 mg by mouth daily.   amoxicillin-clavulanate (AUGMENTIN) 875-125 MG tablet Take 1 tablet by mouth every 12 (twelve) hours.   cyclobenzaprine (FLEXERIL) 5 MG tablet Take 1 tablet (5 mg total) by mouth 3 (three) times daily as needed for muscle spasms.   ferrous sulfate 325 (65 FE) MG tablet Take 1 tablet (325 mg total) by mouth 3 (three) times daily after meals.   glimepiride (AMARYL) 2 MG tablet Take 1 mg by mouth every morning.   HYDROmorphone (DILAUDID) 2 MG tablet Take 1 tablet (2 mg total) by mouth every 3 (three) hours as needed for severe pain (Q4-6 hours PRN).   losartan (COZAAR) 100 MG tablet Take 100 mg by mouth every morning.    metFORMIN (GLUCOPHAGE) 1000 MG tablet Take 1,000 mg by mouth 2 (two) times daily with a meal.   Probiotic CAPS Take 1 capsule by mouth daily.   rivaroxaban (XARELTO) 10 MG TABS tablet Take 1 tablet (10 mg total) by  mouth daily with breakfast.   simvastatin (ZOCOR) 20 MG tablet Take 20 mg by mouth every evening.   traMADol (ULTRAM) 50 MG tablet Take 50 mg by mouth every 4 (four) hours as needed for moderate pain.   No current facility-administered medications for this visit. (Other)   Facility-Administered Medications Ordered in Other Visits (Other)  Medication Route   dexamethasone (DECADRON) injection 10 mg Intravenous      REVIEW OF SYSTEMS:    ALLERGIES Allergies  Allergen Reactions   Latex Dermatitis    PAST MEDICAL HISTORY Past Medical History:  Diagnosis Date   Arthritis THUMB   Diabetes mellitus, type 2 ([facility])    HAS "RAPID DROPS IN BLOOD SUGAR"   Hyperlipidemia    Hypertension    Nuclear sclerotic cataract of both eyes 03/26/2020   Psoriasis    Past Surgical History:  Procedure Laterality Date   COLONOSCOPY WITH PROPOFOL N/A 05/19/2015   Procedure: COLONOSCOPY WITH PROPOFOL;  Surgeon: Charolett Bumpers, MD;  Location: [facility];  Service: Endoscopy;  Laterality: N/A;   HAND SURGERY Bilateral    Bilateral thumbs-"tendon repair"   HERNIA REPAIR     Hiatal hernia   KNEE ARTHROSCOPY  1988 ([facility]PROX)   RIGHT KNEE   TONSILLECTOMY  TOTAL KNEE ARTHROPLASTY Right 08/01/2013   Procedure: RIGHT TOTAL KNEE ARTHROPLASTY;  Surgeon: Jacki Cones, MD;  Location: [facility];  Service: Orthopedics;  Laterality: Right;   TOTAL KNEE ARTHROPLASTY Left 07/31/2014   Procedure: TOTAL LEFT KNEE ARTHROPLASTY;  Surgeon: Jacki Cones, MD;  Location: [facility];  Service: Orthopedics;  Laterality: Left;   VASECTOMY      FAMILY HISTORY Family History  Problem Relation Age of Onset   Diabetes Mother    Diabetes Sister    Diabetes Brother     SOCIAL HISTORY Social History   Tobacco Use   Smoking status: Former    Packs/day: 1.00    Years: 10.00    Pack years: 10.00    Types: Cigarettes    Quit date: 07/27/1979    Years since quitting: 41.7   Smokeless tobacco: Never  Substance Use  Topics   Alcohol use: No   Drug use: No         OPHTHALMIC EXAM:  Base Eye Exam     Visual Acuity (ETDRS)       Right Left   Dist cc 20/60 +2 20/25 +2   Dist ph cc 20/50 +2     Correction: Glasses         Tonometry (Tonopen, 2:34 PM)       Right Left   Pressure 12 14         Pupils       Pupils Dark Light Shape React APD   Right PERRL 5 4 Round Brisk None   Left PERRL 5 4 Round Brisk None         Visual Fields (Counting fingers)       Left Right    Full Full         Extraocular Movement       Right Left    Full Full         Neuro/Psych     Oriented x3: Yes   Mood/Affect: Normal         Dilation     Both eyes: 1.0% Mydriacyl, 2.5% Phenylephrine @ 2:34 PM           Slit Lamp and Fundus Exam     External Exam       Right Left   External Normal Normal         Slit Lamp Exam       Right Left   Lids/Lashes Normal Normal   Conjunctiva/Sclera White and quiet White and quiet   Cornea Clear Clear   Anterior Chamber Deep and quiet Deep and quiet   Iris Round and reactive Round and reactive   Lens Centered posterior chamber intraocular lens Centered posterior chamber intraocular lens   Anterior Vitreous Normal Normal         Fundus Exam       Right Left   Posterior Vitreous Posterior vitreous detachment Normal   Disc Normal Normal   C/D Ratio 0.45 0.45   Macula Normal Normal   Vessels Normal,,, no DR Normal,,, no DR   Periphery Normal Normal            IMAGING AND PROCEDURES  Imaging and Procedures for 04/13/21  OCT, Retina - OU - Both Eyes       Right Eye Quality was good. Scan locations included subfoveal. Central Foveal Thickness: 324. Progression has been stable.   Left Eye Quality was good. Scan locations included subfoveal. Central Foveal Thickness: 314. Progression has been  stable.   Notes OCT, stable OU after weeks post resumption of CPAP use daily weeks  PVD              ASSESSMENT/PLAN:  Diabetes mellitus without complication ([facility]) No detectable diabetic retinopathy  Early stage nonexudative age-related macular degeneration of both eyes Minor OU, no high risk features  OSA (obstructive sleep apnea) Patient reports compliance with CPAP now for the last year.  Does not like the mouth dryness nonetheless probably feels a little bit better overall with its use daily or night     ICD-10-CM   1. Early stage nonexudative age-related macular degeneration of both eyes  H35.3131 OCT, Retina - OU - Both Eyes    2. Pseudophakia  Z96.1     3. Diabetes mellitus without complication ([facility]CC)  E11.9     4. OSA (obstructive sleep apnea)  G47.33       1.  No signs of active maculopathy in either eye  2.  No detectable diabetic retinopathy  3.  Follow-up in 2 years follow-up with Dr. Alden Hipp as scheduled however  Ophthalmic Meds Ordered this visit:  No orders of the defined types were placed in this encounter.      Return in about 2 years (around 04/14/2023) for DILATE OU, OCT.  There are no Patient Instructions on file for this visit.   Explained the diagnoses, plan, and follow up with the patient and they expressed understanding.  Patient expressed understanding of the importance of proper follow up care.   Alford Highland Rasool Rommel M.D. Diseases & Surgery of the Retina and Vitreous [facility] 04/13/21     Abbreviations: M myopia (nearsighted); A astigmatism; H hyperopia (farsighted); P presbyopia; Mrx spectacle prescription;  CTL contact lenses; OD right eye; OS left eye; OU both eyes  XT exotropia; ET esotropia; PEK punctate epithelial keratitis; PEE punctate epithelial erosions; DES dry eye syndrome; MGD meibomian gland dysfunction; ATs artificial tears; PFAT's preservative free artificial tears; NSC nuclear sclerotic cataract; PSC posterior subcapsular cataract; ERM epi-retinal membrane; PVD posterior vitreous detachment; RD retinal  detachment; DM diabetes mellitus; DR diabetic retinopathy; NPDR non-proliferative diabetic retinopathy; PDR proliferative diabetic retinopathy; CSME clinically significant macular edema; DME diabetic macular edema; dbh dot blot hemorrhages; CWS cotton wool spot; POAG primary open angle glaucoma; C/D cup-to-disc ratio; HVF humphrey visual field; GVF goldmann visual field; OCT optical coherence tomography; IOP intraocular pressure; BRVO Branch retinal vein occlusion; CRVO central retinal vein occlusion; CRAO central retinal artery occlusion; BRAO branch retinal artery occlusion; RT retinal tear; SB scleral buckle; PPV pars plana vitrectomy; VH Vitreous hemorrhage; PRP panretinal laser photocoagulation; IVK intravitreal kenalog; VMT vitreomacular traction; MH Macular hole;  NVD neovascularization of the disc; NVE neovascularization elsewhere; AREDS age related eye disease study; ARMD age related macular degeneration; POAG primary open angle glaucoma; EBMD epithelial/anterior basement membrane dystrophy; ACIOL anterior chamber intraocular lens; IOL intraocular lens; PCIOL posterior chamber intraocular lens; Phaco/IOL phacoemulsification with intraocular lens placement; PRK photorefractive keratectomy; LASIK laser assisted in situ keratomileusis; HTN hypertension; DM diabetes mellitus; COPD chronic obstructive pulmonary disease

## 2021-04-13 NOTE — Assessment & Plan Note (Signed)
No detectable diabetic retinopathy 

## 2021-04-28 DIAGNOSIS — H43811 Vitreous degeneration, right eye: Secondary | ICD-10-CM | POA: Diagnosis not present

## 2021-04-28 DIAGNOSIS — E119 Type 2 diabetes mellitus without complications: Secondary | ICD-10-CM | POA: Diagnosis not present

## 2021-04-28 DIAGNOSIS — H04123 Dry eye syndrome of bilateral lacrimal glands: Secondary | ICD-10-CM | POA: Diagnosis not present

## 2021-04-28 DIAGNOSIS — Z961 Presence of intraocular lens: Secondary | ICD-10-CM | POA: Diagnosis not present

## 2021-06-26 DIAGNOSIS — L400 Psoriasis vulgaris: Secondary | ICD-10-CM | POA: Diagnosis not present

## 2021-06-26 DIAGNOSIS — L570 Actinic keratosis: Secondary | ICD-10-CM | POA: Diagnosis not present

## 2021-08-30 DIAGNOSIS — L409 Psoriasis, unspecified: Secondary | ICD-10-CM | POA: Diagnosis not present

## 2021-08-30 DIAGNOSIS — Z7984 Long term (current) use of oral hypoglycemic drugs: Secondary | ICD-10-CM | POA: Diagnosis not present

## 2021-08-30 DIAGNOSIS — E119 Type 2 diabetes mellitus without complications: Secondary | ICD-10-CM | POA: Diagnosis not present

## 2021-08-30 DIAGNOSIS — M1990 Unspecified osteoarthritis, unspecified site: Secondary | ICD-10-CM | POA: Diagnosis not present

## 2021-08-30 DIAGNOSIS — G8929 Other chronic pain: Secondary | ICD-10-CM | POA: Diagnosis not present

## 2021-08-30 DIAGNOSIS — N400 Enlarged prostate without lower urinary tract symptoms: Secondary | ICD-10-CM | POA: Diagnosis not present

## 2021-08-30 DIAGNOSIS — E785 Hyperlipidemia, unspecified: Secondary | ICD-10-CM | POA: Diagnosis not present

## 2021-08-30 DIAGNOSIS — K219 Gastro-esophageal reflux disease without esophagitis: Secondary | ICD-10-CM | POA: Diagnosis not present

## 2021-08-30 DIAGNOSIS — I10 Essential (primary) hypertension: Secondary | ICD-10-CM | POA: Diagnosis not present

## 2021-09-16 DIAGNOSIS — L570 Actinic keratosis: Secondary | ICD-10-CM | POA: Diagnosis not present

## 2021-09-16 DIAGNOSIS — L218 Other seborrheic dermatitis: Secondary | ICD-10-CM | POA: Diagnosis not present

## 2021-09-16 DIAGNOSIS — D1801 Hemangioma of skin and subcutaneous tissue: Secondary | ICD-10-CM | POA: Diagnosis not present

## 2021-10-02 DIAGNOSIS — E782 Mixed hyperlipidemia: Secondary | ICD-10-CM | POA: Diagnosis not present

## 2021-10-02 DIAGNOSIS — I350 Nonrheumatic aortic (valve) stenosis: Secondary | ICD-10-CM | POA: Diagnosis not present

## 2021-10-02 DIAGNOSIS — E1169 Type 2 diabetes mellitus with other specified complication: Secondary | ICD-10-CM | POA: Diagnosis not present

## 2021-10-02 DIAGNOSIS — N400 Enlarged prostate without lower urinary tract symptoms: Secondary | ICD-10-CM | POA: Diagnosis not present

## 2021-10-02 DIAGNOSIS — I10 Essential (primary) hypertension: Secondary | ICD-10-CM | POA: Diagnosis not present

## 2021-10-20 DIAGNOSIS — U071 COVID-19: Secondary | ICD-10-CM

## 2021-10-20 HISTORY — DX: COVID-19: U07.1

## 2022-02-25 DIAGNOSIS — M25562 Pain in left knee: Secondary | ICD-10-CM | POA: Diagnosis not present

## 2022-02-25 DIAGNOSIS — M25561 Pain in right knee: Secondary | ICD-10-CM | POA: Diagnosis not present

## 2022-02-25 DIAGNOSIS — Z96652 Presence of left artificial knee joint: Secondary | ICD-10-CM | POA: Diagnosis not present

## 2022-02-25 DIAGNOSIS — M5451 Vertebrogenic low back pain: Secondary | ICD-10-CM | POA: Diagnosis not present

## 2022-02-25 DIAGNOSIS — Z96651 Presence of right artificial knee joint: Secondary | ICD-10-CM | POA: Diagnosis not present

## 2022-03-16 DIAGNOSIS — L218 Other seborrheic dermatitis: Secondary | ICD-10-CM | POA: Diagnosis not present

## 2022-03-16 DIAGNOSIS — L814 Other melanin hyperpigmentation: Secondary | ICD-10-CM | POA: Diagnosis not present

## 2022-03-16 DIAGNOSIS — D224 Melanocytic nevi of scalp and neck: Secondary | ICD-10-CM | POA: Diagnosis not present

## 2022-03-16 DIAGNOSIS — D485 Neoplasm of uncertain behavior of skin: Secondary | ICD-10-CM | POA: Diagnosis not present

## 2022-03-16 DIAGNOSIS — L821 Other seborrheic keratosis: Secondary | ICD-10-CM | POA: Diagnosis not present

## 2022-03-16 DIAGNOSIS — D225 Melanocytic nevi of trunk: Secondary | ICD-10-CM | POA: Diagnosis not present

## 2022-03-16 DIAGNOSIS — L570 Actinic keratosis: Secondary | ICD-10-CM | POA: Diagnosis not present

## 2022-04-16 DIAGNOSIS — L409 Psoriasis, unspecified: Secondary | ICD-10-CM | POA: Diagnosis not present

## 2022-04-16 DIAGNOSIS — I350 Nonrheumatic aortic (valve) stenosis: Secondary | ICD-10-CM | POA: Diagnosis not present

## 2022-04-16 DIAGNOSIS — E782 Mixed hyperlipidemia: Secondary | ICD-10-CM | POA: Diagnosis not present

## 2022-04-16 DIAGNOSIS — I10 Essential (primary) hypertension: Secondary | ICD-10-CM | POA: Diagnosis not present

## 2022-04-16 DIAGNOSIS — G4730 Sleep apnea, unspecified: Secondary | ICD-10-CM | POA: Diagnosis not present

## 2022-04-16 DIAGNOSIS — E1169 Type 2 diabetes mellitus with other specified complication: Secondary | ICD-10-CM | POA: Diagnosis not present

## 2022-04-16 DIAGNOSIS — F419 Anxiety disorder, unspecified: Secondary | ICD-10-CM | POA: Diagnosis not present

## 2022-04-16 DIAGNOSIS — Z0000 Encounter for general adult medical examination without abnormal findings: Secondary | ICD-10-CM | POA: Diagnosis not present

## 2022-04-16 DIAGNOSIS — N400 Enlarged prostate without lower urinary tract symptoms: Secondary | ICD-10-CM | POA: Diagnosis not present

## 2022-04-28 ENCOUNTER — Encounter: Payer: Self-pay | Admitting: Cardiology

## 2022-04-28 ENCOUNTER — Ambulatory Visit: Payer: Medicare PPO | Admitting: Cardiology

## 2022-04-28 ENCOUNTER — Encounter (INDEPENDENT_AMBULATORY_CARE_PROVIDER_SITE_OTHER): Payer: Medicare PPO | Admitting: Ophthalmology

## 2022-04-28 VITALS — BP 152/84 | HR 55 | Ht 64.0 in | Wt 166.8 lb

## 2022-04-28 DIAGNOSIS — I10 Essential (primary) hypertension: Secondary | ICD-10-CM

## 2022-04-28 DIAGNOSIS — E119 Type 2 diabetes mellitus without complications: Secondary | ICD-10-CM | POA: Diagnosis not present

## 2022-04-28 DIAGNOSIS — I350 Nonrheumatic aortic (valve) stenosis: Secondary | ICD-10-CM

## 2022-04-28 DIAGNOSIS — R072 Precordial pain: Secondary | ICD-10-CM | POA: Diagnosis not present

## 2022-04-28 DIAGNOSIS — E785 Hyperlipidemia, unspecified: Secondary | ICD-10-CM | POA: Diagnosis not present

## 2022-04-28 DIAGNOSIS — G4733 Obstructive sleep apnea (adult) (pediatric): Secondary | ICD-10-CM | POA: Diagnosis not present

## 2022-04-28 DIAGNOSIS — E8881 Metabolic syndrome: Secondary | ICD-10-CM | POA: Diagnosis not present

## 2022-04-28 MED ORDER — METOPROLOL TARTRATE 25 MG PO TABS: 25.0000 mg | ORAL_TABLET | Freq: Once | ORAL | 0 refills | Status: DC

## 2022-04-28 NOTE — Progress Notes (Signed)
Primary Care Provider: Georgann HousekeeperHusain, Karrar, MD [facility] Cardiologist: William Lemmaavid Himani Corona, MD Electrophysiologist: None  Clinic Note: Chief Complaint  Patient presents with   New Patient (Initial Visit)    Pretty asymptomatic.   Chest Pain    Intermittent chest pain off and on.   Aortic Stenosis    Previously diagnosed with mild AS.  Last echo in 2022 but not available.    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  Unfortunate do not have the most recent echo report from 2022.  We will try to reach out to get a copy of the report but also a disc.  Depending on the numbers look like, probably do not need to follow-up with another echo until 2024-2025.  (Mild AS only needs to be followed up every 3 years.)  We talked about concerning symptoms associated with AS We will also get a aortic valve calcium score as part of his Coronary CTA.      Relevant Medications   amLODipine (NORVASC) 2.5 MG tablet   rosuvastatin (CRESTOR) 5 MG tablet   Hyperlipidemia with target LDL less than 70 (Chronic)    LDL 72.  Pretty close to goal for his risk factors on current dose of rosuvastatin 5 mg.  TG level high, may want to consider adjustment of diabetes medications if this is related to glucose levels/diet. ->  Consider Vascepa        Relevant Medications   amLODipine (NORVASC) 2.5 MG tablet   rosuvastatin (CRESTOR) 5 MG tablet   Other Relevant Orders   CT CORONARY MORPH W/CTA COR W/SCORE W/CA W/CM &/OR WO/CM   Essential hypertension (Chronic)    High today on current meds.  May need to further titrate pending recheck.  BP was not as high at PCPs office may be just simply stress related.      Relevant Medications   amLODipine (NORVASC) 2.5 MG tablet   rosuvastatin (CRESTOR) 5 MG tablet     Other    Precordial pain - Primary    Overly concerning that he has chest discomfort off and on.  He also says he has been having some more palpitations that occur with exertion.  Somewhat concerning for possible atypical anginal type symptoms.  With history of diabetes hypertension hyperlipidemia reasonable to exclude CAD. I would also like to get a good assessment of his aortic valve calcification.  Plan: Coronary CTA-FFR ct.  Can evaluate aortic valve calcium score He has baseline bradycardia so he may not necessarily need to beta-blocker that we are ordering.      Relevant Orders   EKG 12-Lead (Completed)   CT CORONARY MORPH W/CTA COR W/SCORE W/CA W/CM &/OR WO/CM   Metabolic syndrome (Chronic)    DM-2, HTN, HLD with TG of 268, not obese (overweight), but normal HDL.  On metformin and glimepiride for diabetes => unfortunately I do not have an A1c on him.  Management PCP.   Blood pressure is high today on losartan and low-dose amlodipine.  We will need to recheck in follow-up and potentially address.  No beta-blocker because of bradycardia. Lipids look pretty well controlled with LDL 72.  Triglycerides still elevated.  He is on rosuvastatin 5 mg, we could potentially try to keep titrate up to 10 or add Vascepa.  I think it  may also be related to his diabetes.  Document dietary adjustment.       Diabetes mellitus without complication (HCC) (Chronic)    As far she can tell, he does not have any renal -> on metformin and Amaryl.  A1c not available.  Management PCP.      Relevant Medications   rosuvastatin (CRESTOR) 5 MG tablet   Other Relevant Orders   CT CORONARY MORPH W/CTA COR W/SCORE W/CA W/CM &/OR WO/CM   OSA (obstructive sleep apnea) (Chronic)    Encouraged continued compliance. He does not enjoy wearing it, but he does.      ===================================  HPI:    [name] is a 81 y.o. male with DM-2, HLD, HTN, psoriasis, OSA on CPAP who is being seen today for the  evaluation of MILD AORTIC STENOSIS at the request of William Housekeeper, MD.  [name] was last seen on 04/16/2022 by Dr. Eula [name].  Most of there for follow-up of diabetes, hypertension and hyperlipidemia.  He noted intermittent chest pain for 1 to 2 minutes and also has a history of mild aortic stenosis (by echo in 2022) with mild diastolic dysfunction.  Referred to cardiology.  Recent Hospitalizations: N/A  Reviewed  CV studies:    The following studies were reviewed today: (if available, images/films reviewed: From Epic Chart or Care Everywhere) TTE 11/28/2015: (Murmur) EF 60 to 65%.  Normal LV systolic function with GR 1 DD (normal for age.  Mild AS.  Mean gradient 10 mmHg. Echocardiogram 2022-(report not available)-summary indicated mild diastolic dysfunction with mild aortic stenosis.  No further details.   Interval History:   [name] presents here today at the request of Dr. Eula [name].  He is got several minor complaints that tally up.  He has been noticing this off and on chest pain this been going on for few months.  It lasts about 5 to 10 minutes at longest but usually more like 1 or 2 minutes.  It is intermittent not necessarily associated with exertion.  He says it is a localized jabbing leg pain at first and then it becomes more generalized discomfort across the left side of his chest.  This sharp segment only lasts a about 30 seconds then the generalized discomfort lasts 3 to 4 minutes but none all told no more than 5 minutes.  He does point out that this may or may not happen with exertion but it does not necessarily get worse with exertion. He does acknowledge being a little deconditioned but he tries to walk about 3 miles 4 days a week he usually does this at the [facility] walking on the treadmill on the track.  He also does some calisthenic exercises and weight machines as well.Marland Kitchen  Unfortunately he has been having some issues with his knee his right knee has been bothering him quite a  bit and is limiting his walking now.  He does not necessarily notice exertional dyspnea associated with this.  He says he feels occasional palpitations off and on but is usually with exertion he feels a heart rate going up fast and can sometimes go as far as 30-40 beats a minute faster than usual.  He says it recovers pretty quickly though after exercise.  He does note though that overall his energy level has gotten worse over the last several months.  He still does his walking but not as vigorously.  He did try to walk an 18-minute mile when he previously used to walk a little bit faster  and that may be more like 15-minute miles.  Cardiovascular ROS summary: positive for - chest pain, dyspnea on exertion, irregular heartbeat, palpitations, rapid heart rate, and mild decrease in exercise tolerance/energy.  Exertional dyspnea is relieved with increased level of exertion more related to decreased energy. negative for - edema, orthopnea, paroxysmal nocturnal dyspnea, shortness of breath, or lightheadedness, dizziness or wooziness, syncope/near syncope or TIA/amaurosis fugax, claudication     REVIEWED OF SYSTEMS   Review of Systems  Constitutional:  Positive for malaise/fatigue (Less energy than he used to have.  Mild exercise intolerance). Negative for weight loss.  HENT:  Negative for congestion, nosebleeds and sinus pain.   Respiratory:  Negative for cough, shortness of breath (Only if he overdoes it) and wheezing.   Cardiovascular:        Per HPI  Gastrointestinal:  Negative for abdominal pain, blood in stool, heartburn and melena.  Genitourinary:  Negative for dysuria and hematuria.  Musculoskeletal:  Positive for joint pain (Right knee). Negative for back pain, falls and myalgias.  Neurological:  Negative for dizziness, focal weakness and headaches.  Endo/Heme/Allergies:  Does not bruise/bleed easily.  Psychiatric/Behavioral:  Negative for depression and memory loss. The patient is not  nervous/anxious.    I have reviewed and (if needed) personally updated the patient's problem list, medications, allergies, past medical and surgical history, social and family history.   PAST MEDICAL HISTORY   Past Medical History:  Diagnosis Date   Allergic rhinitis    Arthritis THUMB   COVID-19 10/20/2021   Took Paxlovid   Diabetes mellitus, type 2 (HCC)    HAS "RAPID DROPS IN BLOOD SUGAR"; on metformin and glimepiride   Diverticulitis large intestine    History of Bell's palsy    Left-sided-resolved   Hyperlipidemia    -Switch to rosuvastatin 5 mg   Hypertension    On losartan 100 mg daily as well as amlodipine 2.5 mg daily.   Mild aortic stenosis by prior echocardiography 2017   Report not available, but reported as mild stenosis.  (Last echo was 2022.   Nuclear sclerotic cataract of both eyes 03/26/2020   OSA on CPAP    10 cm H2O pressure; not using CPAP since May 2018   Psoriasis    Involving the scalp; followed by Dr. 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  Normal LV systolic function with GR 1 DD  (normal for age.  Mild AS.  Mean gradient 10 mmHg.=> Follow-up echo 2022 read as stable stable EF and stable AS   VASECTOMY  Immunization History  Administered Date(s) Administered   Influenza,inj,Quad PF,6+ Mos 08/03/2013   Tdap 01/26/2018    MEDICATIONS/ALLERGIES   Current Meds  Medication Sig   acetaminophen (TYLENOL) 650 MG CR tablet Take 1,300 mg by mouth daily.   amLODipine (NORVASC) 2.5 MG tablet Take 2.5 mg by mouth daily.   glimepiride (AMARYL) 2 MG tablet Take 1 mg by mouth every morning.   ketoconazole (NIZORAL) 2 % shampoo    losartan (COZAAR) 100 MG tablet Take 100 mg by mouth every morning.    metFORMIN (GLUCOPHAGE) 1000 MG tablet Take 1,000 mg by mouth 2 (two) times daily with a meal.   metoprolol tartrate (LOPRESSOR) 25 MG tablet Take 1 tablet (25 mg total) by mouth once for 1 dose. TAKE TWO HOURS PRIOR TO  SCHEDULE CARDIAC TEST   rosuvastatin (CRESTOR) 5 MG tablet Take 5 mg by mouth daily.   tamsulosin (FLOMAX) 0.4 MG CAPS capsule Take 0.4 mg by mouth at bedtime.    Allergies  Allergen Reactions   Latex Dermatitis    SOCIAL HISTORY/FAMILY HISTORY   Reviewed in Epic:   Social History   Tobacco Use   Smoking status: Former    Packs/day: 1.00    Years: 10.00    Total pack years: 10.00    Types: Cigarettes    Quit date: 07/27/1979    Years since quitting: 42.7   Smokeless tobacco: Never  Substance Use Topics   Alcohol use: No   Drug use: No   Social History   Social History Narrative   Married father 6: Fibroids, 1 daughter with peritoneal cancer (still lives in [place].   He himself is a native [place].   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      Walks about 4 days a week 3 miles at a time..   Former smoker quit 1980.   Has a couple cups of coffee but no significant amount of caffeine   Family History  Problem Relation Age of Onset   Diabetes Mother    Stroke Father 31   Diabetes Sister    Diabetes Brother      OBJCTIVE -PE, EKG, labs   Wt Readings from Last 3 Encounters:  04/28/22 166 lb 12.8 oz (75.7 kg)  01/25/18 166 lb (75.3 kg)  05/19/15 175 lb (79.4 kg)    Physical Exam: BP (!) 152/84   Pulse (!) 55   Ht 5\' 4"  (1.626 m)   Wt 166 lb 12.8 oz (75.7 kg)   SpO2 95%   BMI 28.63 kg/m  Physical Exam Vitals reviewed.  Constitutional:      General: He is not in acute distress.    Appearance: Normal appearance. He is not ill-appearing or toxic-appearing.     Comments: Overweight, but not obese.  Well-groomed.  Healthy-appearing.  HENT:     Head: Normocephalic and atraumatic.  Eyes:     Extraocular Movements: Extraocular movements intact.     Pupils: Pupils are equal, round, and reactive to light.  Neck:     Vascular: Normal carotid pulses. No carotid bruit, hepatojugular reflux or JVD.  Cardiovascular:     Rate and Rhythm: Regular rhythm. Bradycardia present. No extrasystoles are present.    Pulses: Normal pulses.     Heart sounds: S1 normal and S2 normal. Murmur heard.     High-pitched harsh crescendo-decrescendo midsystolic murmur is present with a grade of 2/6 at the upper right sternal border radiating to the neck.     No  friction rub. No gallop.  Pulmonary:     Effort: Pulmonary effort is normal. No respiratory distress.     Breath sounds: Normal breath sounds. No wheezing, rhonchi or rales.  Chest:     Chest wall: No tenderness.  Abdominal:     General: Abdomen is flat. Bowel sounds are normal. There is no distension.     Palpations: Abdomen is soft. There is no mass.     Tenderness: There is no abdominal tenderness. There is no guarding or rebound.     Hernia: No hernia is present.     Comments: No HSM or bruit  Musculoskeletal:        General: No swelling. Normal range of motion.     Cervical back: Normal range of motion and neck supple.  Neurological:     General: No focal deficit present.     Mental Status: He is alert and oriented to person, place, and time.      Cranial Nerves: No cranial nerve deficit.     Motor: No weakness.     Gait: Gait normal.  Psychiatric:        Mood and Affect: Mood normal.        Behavior: Behavior normal.        Thought Content: Thought content normal.        Judgment: Judgment normal.     Adult ECG Report  Rate: 55;  Rhythm: sinus bradycardia and otherwise normal axis, intervals and durations. ;   Narrative Interpretation: Normal  Recent Labs:   04/16/2022: TC 159, TG 268, HDL 44, LDL 72.  TSH 1.67.  NA 141, K+ 4.3, CL 105, CO2 27, BUN 26, CR 1.0, GLU 103, CA 10, ALP 44, AST 16, ALT 23.;;  WBC 7.4, H/H14.2/42.2.  PLT 218.=> A1c not reported. No results found for: "CHOL", "HDL", "LDLCALC", "LDLDIRECT", "TRIG", "CHOLHDL" Lab Results  Component Value Date   CREATININE 1.07 01/25/2018   BUN 16 01/25/2018   NA 135 01/25/2018   K 3.9 01/25/2018   CL 99 (L) 01/25/2018   CO2 23 01/25/2018      Latest Ref Rng & Units 01/25/2018   10:05 PM 08/02/2014    4:57 AM 08/01/2014    4:28 AM  CBC  WBC 4.0 - 10.5 K/uL 17.4  10.3  13.8   Hemoglobin 13.0 - 17.0 g/dL 40.9  81.1  91.4   Hematocrit 39.0 - 52.0 % 41.4  33.7  34.2   Platelets 150 - 400 K/uL 204  213  249     No results found for: "HGBA1C" No results found for: "TSH"  ==================================================  COVID-19 Education: The signs and symptoms of COVID-19 were discussed with the patient and how to seek care for testing (follow up with PCP or arrange E-visit).    I spent a total of 26 minutes with the patient spent in direct patient consultation.  Additional time spent with chart review  / charting (studies, outside notes, etc):35 min Total Time: 61 min  Current medicines are reviewed at length with the patient today.  (+/- concerns) N/A  This visit occurred during the SARS-CoV-2 public health emergency.  Safety protocols were in place, including screening questions prior to the visit, additional usage of staff PPE, and extensive cleaning  of exam room while observing appropriate contact time as indicated for disinfecting solutions.  Notice: This dictation was prepared with Dragon dictation along with smart phrase technology. Any transcriptional errors that result from this process are unintentional and may  not be corrected upon review.   Studies Ordered:  Orders Placed This Encounter  Procedures   CT CORONARY MORPH W/CTA COR W/SCORE W/CA W/CM &/OR WO/CM   EKG 12-Lead   Meds ordered this encounter  Medications   metoprolol tartrate (LOPRESSOR) 25 MG tablet    Sig: Take 1 tablet (25 mg total) by mouth once for 1 dose. TAKE TWO HOURS PRIOR TO  SCHEDULE CARDIAC TEST    Dispense:  1 tablet    Refill:  0    Patient Instructions / Medication Changes & Studies & Tests Ordered   Patient Instructions  Medication Instructions:   See instruction below - Metoprolol tartrate 25 mg one dose only  *If you need a refill on your cardiac medications before your next appointment, please call your pharmacy*   Lab Work: Not needed - had lab 04/2022     Testing/Procedures: Will be schedule at [facility] - radiology dept -[address]  Your physician has requested that you have coronary CTA. Cardiac computed tomography (CT) is a painless test that uses an x-ray machine to take clear, detailed pictures of your heart arteries  Please follow instruction sheet as given.    Follow-Up: At [facility], you and your health needs are our priority.  As part of our continuing mission to provide you with exceptional heart care, we have created designated Provider Care Teams.  These Care Teams include your primary Cardiologist (physician) and Advanced Practice Providers (APPs -  Physician Assistants and Nurse Practitioners) who all work together to provide you with the care you need, when you need it.  We recommend signing up for the patient portal called "MyChart".  Sign up information is provided on this After Visit Summary.   MyChart is used to connect with patients for Virtual Visits (Telemedicine).  Patients are able to view lab/test results, encounter notes, upcoming appointments, etc.  Non-urgent messages can be sent to your provider as well.   To learn more about what you can do with MyChart, go to [email or web address].    Your next appointment:   3 month(s)  The format for your next appointment:   In Person  Provider:   Bryan Lemma, MD     Other Instructions   Your cardiac CT will be scheduled at  the below location:   [facility] [address] [place], [place] [address] [phone]     Please follow all enclosed instructions carefully (unless otherwise directed): For scheduling needs, including cancellations and rescheduling, please call [place], [phone].   Important Information About Sugar          William Lemma, M.D., M.S. Interventional Cardiologist   Pager # [phone] Phone # [phone] [address] [place], [place] [address]   Thank you for choosing Heartcare at [facility]!!

## 2022-04-28 NOTE — Patient Instructions (Signed)
Medication Instructions:   See instruction below - Metoprolol tartrate 25 mg one dose only  *If you need a refill on your cardiac medications before your next appointment, please call your pharmacy*   Lab Work: Not needed - had lab 04/2022     Testing/Procedures: Will be schedule at [facility] - radiology dept -[address]  Your physician has requested that you have coronary CTA. Cardiac computed tomography (CT) is a painless test that uses an x-ray machine to take clear, detailed pictures of your heart arteries  Please follow instruction sheet as given.    Follow-Up: At [facility], you and your health needs are our priority.  As part of our continuing mission to provide you with exceptional heart care, we have created designated Provider Care Teams.  These Care Teams include your primary Cardiologist (physician) and Advanced Practice Providers (APPs -  Physician Assistants and Nurse Practitioners) who all work together to provide you with the care you need, when you need it.  We recommend signing up for the patient portal called "MyChart".  Sign up information is provided on this After Visit Summary.  MyChart is used to connect with patients for Virtual Visits (Telemedicine).  Patients are able to view lab/test results, encounter notes, upcoming appointments, etc.  Non-urgent messages can be sent to your provider as well.   To learn more about what you can do with MyChart, go to [email or web address].    Your next appointment:   3 month(s)  The format for your next appointment:   In Person  Provider:   Bryan Lemma, MD     Other Instructions   Your cardiac CT will be scheduled at  the below location:   [facility] [address] [place], [place] [address] [phone]    If scheduled at [facility], please arrive at the [facility] and Children's Entrance (Entrance C2) of [facility] 30 minutes prior to test start time. You  can use the FREE valet parking offered at entrance C (encouraged to control the heart rate for the test)  Proceed to the [facility] Radiology Department (first floor) to check-in and test prep.  All radiology patients and guests should use entrance C2 at [facility], accessed from [facility], even though the hospital's physical address listed is [address]      Please follow these instructions carefully (unless otherwise directed):  Hold all erectile dysfunction medications at least 3 days (72 hrs) prior to test.  On the Night Before the Test: Be sure to Drink plenty of water. Do not consume any caffeinated/decaffeinated beverages or chocolate 12 hours prior to your test. Do not take any antihistamines 12 hours prior to your test.  On the Day of the Test: Drink plenty of water until 1 hour prior to the test. Do not eat any food 4 hours prior to the test. You may take your regular medications prior to the test.  Take metoprolol (Lopressor)  25 mg two hours prior to test.      After the Test: Drink plenty of water. After receiving IV contrast, you may experience a mild flushed feeling. This is normal. On occasion, you may experience a mild rash up to 24 hours after the test. This is not dangerous. If this occurs, you can take Benadryl 25 mg and increase your fluid intake. If you experience trouble breathing, this can be serious. If it is severe call 911 IMMEDIATELY. If it is  mild, please call our office. If you take any of these medications: Metformin, please do not take 48 hours after completing test unless otherwise instructed.  We will call to schedule your test 2-4 weeks out understanding that some insurance companies will need an authorization prior to the service being performed.   For non-scheduling related questions, please contact the cardiac imaging nurse navigator should you have any questions/concerns: Rockwell Alexandria, Cardiac Imaging Nurse  Navigator Larey Brick, Cardiac Imaging Nurse Navigator  Heart and Vascular Services Direct Office Dial: [phone]   For scheduling needs, including cancellations and rescheduling, please call [place], [phone].   Important Information About Sugar

## 2022-04-29 DIAGNOSIS — H53031 Strabismic amblyopia, right eye: Secondary | ICD-10-CM | POA: Diagnosis not present

## 2022-04-29 DIAGNOSIS — H04123 Dry eye syndrome of bilateral lacrimal glands: Secondary | ICD-10-CM | POA: Diagnosis not present

## 2022-04-29 DIAGNOSIS — E119 Type 2 diabetes mellitus without complications: Secondary | ICD-10-CM | POA: Diagnosis not present

## 2022-04-29 DIAGNOSIS — Z961 Presence of intraocular lens: Secondary | ICD-10-CM | POA: Diagnosis not present

## 2022-04-29 DIAGNOSIS — H43811 Vitreous degeneration, right eye: Secondary | ICD-10-CM | POA: Diagnosis not present

## 2022-04-30 ENCOUNTER — Encounter: Payer: Self-pay | Admitting: Cardiology

## 2022-04-30 DIAGNOSIS — R072 Precordial pain: Secondary | ICD-10-CM | POA: Insufficient documentation

## 2022-04-30 DIAGNOSIS — E785 Hyperlipidemia, unspecified: Secondary | ICD-10-CM | POA: Insufficient documentation

## 2022-04-30 NOTE — Assessment & Plan Note (Addendum)
LDL 72.  Pretty close to goal for his risk factors on current dose of rosuvastatin 5 mg.  TG level high, may want to consider adjustment of diabetes medications if this is related to glucose levels/diet. ->  Consider Vascepa

## 2022-05-01 DIAGNOSIS — E8881 Metabolic syndrome: Secondary | ICD-10-CM | POA: Insufficient documentation

## 2022-05-01 DIAGNOSIS — I10 Essential (primary) hypertension: Secondary | ICD-10-CM | POA: Insufficient documentation

## 2022-05-01 DIAGNOSIS — I350 Nonrheumatic aortic (valve) stenosis: Secondary | ICD-10-CM | POA: Insufficient documentation

## 2022-05-01 NOTE — Assessment & Plan Note (Addendum)
As far she can tell, he does not have any renal -> on metformin and Amaryl.  A1c not available.  Management PCP.

## 2022-05-01 NOTE — Assessment & Plan Note (Signed)
Encouraged continued compliance. He does not enjoy wearing it, but he does.

## 2022-05-01 NOTE — Assessment & Plan Note (Signed)
High today on current meds.  May need to further titrate pending recheck.  BP was not as high at PCPs office may be just simply stress related.

## 2022-05-01 NOTE — Assessment & Plan Note (Signed)
Has a history of error stenosis on a murmur.  Unfortunate do not have the most recent echo report from 2022.  We will try to reach out to get a copy of the report but also a disc.  Depending on the numbers look like, probably do not need to follow-up with another echo until 2024-2025.  (Mild AS only needs to be followed up every 3 years.)  We talked about concerning symptoms associated with AS We will also get a aortic valve calcium score as part of his Coronary CTA.

## 2022-05-01 NOTE — Assessment & Plan Note (Signed)
DM-2, HTN, HLD with TG of 268, not obese (overweight), but normal HDL.   On metformin and glimepiride for diabetes => unfortunately I do not have an A1c on him.  Management PCP.    Blood pressure is high today on losartan and low-dose amlodipine.  We will need to recheck in follow-up and potentially address.  No beta-blocker because of bradycardia.  Lipids look pretty well controlled with LDL 72.  Triglycerides still elevated.  He is on rosuvastatin 5 mg, we could potentially try to keep titrate up to 10 or add Vascepa.  I think it may also be related to his diabetes.  Document dietary adjustment.

## 2022-05-01 NOTE — Assessment & Plan Note (Signed)
Overly concerning that he has chest discomfort off and on.  He also says he has been having some more palpitations that occur with exertion.  Somewhat concerning for possible atypical anginal type symptoms.  With history of diabetes hypertension hyperlipidemia reasonable to exclude CAD. I would also like to get a good assessment of his aortic valve calcification.  Plan: Coronary CTA-FFR ct.  Can evaluate aortic valve calcium score He has baseline bradycardia so he may not necessarily need to beta-blocker that we are ordering.

## 2022-05-18 ENCOUNTER — Telehealth (HOSPITAL_COMMUNITY): Payer: Self-pay | Admitting: Emergency Medicine

## 2022-05-18 NOTE — Telephone Encounter (Signed)
Attempted to call patient regarding upcoming cardiac CT appointment. °Left message on voicemail with name and callback number °Virgie Kunda RN Navigator Cardiac Imaging ° [facility] °[phone] Office °[phone] Cell ° °

## 2022-05-18 NOTE — Telephone Encounter (Signed)
Reaching out to patient to offer assistance regarding upcoming cardiac imaging study; pt verbalizes understanding of appt date/time, parking situation and where to check in, pre-test NPO status and medications ordered, and verified current allergies; name and call back number provided for further questions should they arise Rockwell Alexandria RN Navigator Cardiac Imaging [facility] [phone] office [phone] cell  Denies iv issues  Aware of nitro Daily meds Arrival 200

## 2022-05-19 ENCOUNTER — Ambulatory Visit (HOSPITAL_COMMUNITY)
Admission: RE | Admit: 2022-05-19 | Discharge: 2022-05-19 | Disposition: A | Discharge status: Home / Self Care | Payer: Medicare PPO | Source: Ambulatory Visit | Attending: Cardiovascular Disease | Admitting: Cardiovascular Disease

## 2022-05-19 ENCOUNTER — Ambulatory Visit (HOSPITAL_COMMUNITY)
Admission: RE | Admit: 2022-05-19 | Discharge: 2022-05-19 | Disposition: A | Discharge status: Home / Self Care | Payer: Medicare PPO | Source: Ambulatory Visit | Attending: Cardiology | Admitting: Cardiology

## 2022-05-19 ENCOUNTER — Other Ambulatory Visit: Payer: Self-pay | Admitting: Cardiovascular Disease

## 2022-05-19 DIAGNOSIS — R072 Precordial pain: Secondary | ICD-10-CM | POA: Diagnosis not present

## 2022-05-19 DIAGNOSIS — I2510 Atherosclerotic heart disease of native coronary artery without angina pectoris: Secondary | ICD-10-CM | POA: Diagnosis not present

## 2022-05-19 DIAGNOSIS — R931 Abnormal findings on diagnostic imaging of heart and coronary circulation: Secondary | ICD-10-CM

## 2022-05-19 DIAGNOSIS — E785 Hyperlipidemia, unspecified: Secondary | ICD-10-CM | POA: Diagnosis not present

## 2022-05-19 DIAGNOSIS — E119 Type 2 diabetes mellitus without complications: Secondary | ICD-10-CM | POA: Insufficient documentation

## 2022-05-19 HISTORY — DX: Atherosclerotic heart disease of native coronary artery without angina pectoris: I25.10

## 2022-05-19 MED ORDER — IOHEXOL 350 MG/ML SOLN
100.0000 mL | Freq: Once | INTRAVENOUS | Status: AC | PRN
  Administered 2022-05-19: 100 mL via INTRAVENOUS

## 2022-05-19 MED ORDER — NITROGLYCERIN 0.4 MG SL SUBL
SUBLINGUAL_TABLET | SUBLINGUAL | Status: AC
  Filled 2022-05-19: qty 2

## 2022-05-19 MED ORDER — NITROGLYCERIN 0.4 MG SL SUBL
0.8000 mg | SUBLINGUAL_TABLET | Freq: Once | SUBLINGUAL | Status: AC
  Administered 2022-05-19: 0.8 mg via SUBLINGUAL

## 2022-05-20 ENCOUNTER — Ambulatory Visit (HOSPITAL_BASED_OUTPATIENT_CLINIC_OR_DEPARTMENT_OTHER)
Admission: RE | Admit: 2022-05-20 | Discharge: 2022-05-20 | Disposition: A | Discharge status: Home / Self Care | Payer: Medicare PPO | Source: Ambulatory Visit | Attending: Cardiovascular Disease | Admitting: Cardiovascular Disease

## 2022-05-20 DIAGNOSIS — E119 Type 2 diabetes mellitus without complications: Secondary | ICD-10-CM | POA: Diagnosis not present

## 2022-05-20 DIAGNOSIS — R072 Precordial pain: Secondary | ICD-10-CM | POA: Diagnosis not present

## 2022-05-20 DIAGNOSIS — R931 Abnormal findings on diagnostic imaging of heart and coronary circulation: Secondary | ICD-10-CM

## 2022-05-20 DIAGNOSIS — E785 Hyperlipidemia, unspecified: Secondary | ICD-10-CM | POA: Diagnosis not present

## 2022-05-27 DIAGNOSIS — M7918 Myalgia, other site: Secondary | ICD-10-CM | POA: Diagnosis not present

## 2022-05-27 DIAGNOSIS — M25561 Pain in right knee: Secondary | ICD-10-CM | POA: Diagnosis not present

## 2022-05-27 DIAGNOSIS — Z96651 Presence of right artificial knee joint: Secondary | ICD-10-CM | POA: Diagnosis not present

## 2022-05-27 DIAGNOSIS — M5136 Other intervertebral disc degeneration, lumbar region: Secondary | ICD-10-CM | POA: Diagnosis not present

## 2022-06-10 DIAGNOSIS — M5441 Lumbago with sciatica, right side: Secondary | ICD-10-CM | POA: Diagnosis not present

## 2022-06-10 DIAGNOSIS — M5442 Lumbago with sciatica, left side: Secondary | ICD-10-CM | POA: Diagnosis not present

## 2022-06-15 DIAGNOSIS — M545 Low back pain, unspecified: Secondary | ICD-10-CM | POA: Diagnosis not present

## 2022-06-16 ENCOUNTER — Telehealth: Payer: Self-pay | Admitting: *Deleted

## 2022-06-16 DIAGNOSIS — I350 Nonrheumatic aortic (valve) stenosis: Secondary | ICD-10-CM

## 2022-06-16 NOTE — Telephone Encounter (Signed)
Left message to call back  - would like to schedule appointment 8/29 /23 with Dr Herbie Baltimore. Patient needs an echo prior / order placed

## 2022-06-16 NOTE — Telephone Encounter (Signed)
-----   Message from Marykay Lex, MD sent at 06/02/2022 10:46 PM EDT ----- Coronary CT angiogram results: No significant noncardiac findings  Coronary Calcium Score significant elevated at 2127!! - -Mild plaque in the left main coronary artery, mild to moderate proximal plaque in left anterior descending (LAD) with multiple moderate to severe calcified plaque lesions in the mid to distal LAD ranging from 50 to 69%. The circumflex has calcified proximal to mid narrowing of 50 to 69% with diffuse disease distally.  The first obtuse marginal sidebranch has mild to moderate calcified plaque, and a small caliber second is marginal has significant 79% stenosis that is calcified. The right coronary artery has mild proximal distal plaque with mid plaque being moderate 25 to 49%.  Distal branches are also 25 to 49%.  Also noted is a heavily calcified aortic valve with valve calcium score 1428 consistent with moderate aortic stenosis.  These findings are significant, but not dramatically severe.  Imaging was then sent for offline evaluation with Fractional Flow Reserve (to be degenerated "stressed test "evaluation). ->  This test, a significant value would be less than 0.80.  The only areas that were found to have significant values are the very distal LAD & 3nd Obtuse Marginal (OM2) - that is small.   These results would suggest that we optimize medical management first before considering invasive evaluation.  Can discuss more in detail in clinic. => Recommend that we try to get him in sooner he is scheduled November visit to discuss results, and fully understand his symptoms..  In the interim, I would be calcification of the aortic valve suggesting moderate aortic stenosis, we should probably go ahead and order a 2D echocardiogram to before he returns for follow-up.  Bryan Lemma, MD  Jasmine December - > can we get him back in to see me in Sept to talk about results. -- Not Emergent, but too much to talk about  over phone -- Echo b4 visit   Dx Moderate Calcific Aortic Stenosis  Bryan Lemma, MD

## 2022-06-16 NOTE — Telephone Encounter (Signed)
Spoke to patient wife  -  f/u appointment schedule 07/12/22   and  echo 06/29/22

## 2022-06-23 DIAGNOSIS — M5136 Other intervertebral disc degeneration, lumbar region: Secondary | ICD-10-CM | POA: Diagnosis not present

## 2022-06-23 DIAGNOSIS — M48062 Spinal stenosis, lumbar region with neurogenic claudication: Secondary | ICD-10-CM | POA: Diagnosis not present

## 2022-06-28 DIAGNOSIS — M5416 Radiculopathy, lumbar region: Secondary | ICD-10-CM | POA: Diagnosis not present

## 2022-06-29 ENCOUNTER — Ambulatory Visit (HOSPITAL_COMMUNITY): Payer: Medicare PPO | Attending: Cardiovascular Disease

## 2022-06-29 DIAGNOSIS — I350 Nonrheumatic aortic (valve) stenosis: Secondary | ICD-10-CM

## 2022-06-29 LAB — ECHOCARDIOGRAM COMPLETE
AR max vel: 1.24 cm2
AV Area VTI: 1.09 cm2
AV Area mean vel: 1.44 cm2
AV Mean grad: 13 mmHg
AV Peak grad: 28.3 mmHg
Ao pk vel: 2.66 m/s
Area-P 1/2: 3.03 cm2
P 1/2 time: 632 msec
S' Lateral: 2.8 cm

## 2022-07-12 ENCOUNTER — Encounter: Payer: Self-pay | Admitting: Cardiology

## 2022-07-12 ENCOUNTER — Ambulatory Visit: Payer: Medicare PPO | Attending: Cardiology | Admitting: Cardiology

## 2022-07-12 VITALS — BP 132/80 | HR 63 | Ht 65.0 in | Wt 167.6 lb

## 2022-07-12 DIAGNOSIS — I10 Essential (primary) hypertension: Secondary | ICD-10-CM

## 2022-07-12 DIAGNOSIS — E8881 Metabolic syndrome: Secondary | ICD-10-CM

## 2022-07-12 DIAGNOSIS — G4733 Obstructive sleep apnea (adult) (pediatric): Secondary | ICD-10-CM

## 2022-07-12 DIAGNOSIS — I2510 Atherosclerotic heart disease of native coronary artery without angina pectoris: Secondary | ICD-10-CM | POA: Insufficient documentation

## 2022-07-12 DIAGNOSIS — I25118 Atherosclerotic heart disease of native coronary artery with other forms of angina pectoris: Secondary | ICD-10-CM

## 2022-07-12 DIAGNOSIS — E785 Hyperlipidemia, unspecified: Secondary | ICD-10-CM

## 2022-07-12 DIAGNOSIS — I350 Nonrheumatic aortic (valve) stenosis: Secondary | ICD-10-CM

## 2022-07-12 MED ORDER — NITROGLYCERIN 0.4 MG SL SUBL: 0.4000 mg | SUBLINGUAL_TABLET | SUBLINGUAL | 6 refills | Status: DC | PRN

## 2022-07-12 NOTE — Progress Notes (Signed)
Primary Care Provider: Georgann Housekeeper, MD [facility] cardiologist: Bryan Lemma, MD Electrophysiologist: None  Clinic Note: Chief Complaint  Patient presents with   Follow-up    Test results   Chest Pain    Less prominent.   ===================================  ASSESSMENT/PLAN   Problem List Items Addressed This Visit       Cardiology Problems   Coronary artery disease involving native coronary artery of native heart - Primary (Chronic)    Pretty high Coronary Calcium Score with disease in the LAD and LCx.  Distal LAD with CT FFR positive as well as small OM branch.  The LAD lesions would probably be sequential diffuse lesions without target for PCI based on findings.  OM 2 small for PCI.  These findings go along with the his symptoms.  He really does not have true anginal type chest discomfort.  Not exertional with presignificant mount of exertion.  Chest discomfort seems to be more musculoskeletal in nature.  Continue aggressive risk factor modification. BP pretty well controlled,  we do have room to potentially increase amlodipine dose.  Monitor closely. Continue losartan. Target LDL should be less than 70  (currently 72 on low-dose rosuvastatin. => Low threshold to increase to 10 mg. He is not taking a daily aspirin, but he takes 325 mg just but every other day for back pain.   I have asked that he take 81 mg on the days he does not take a full dose. Consider SGLT2 inhibitor versus GLP-1 agonist with A1c of 7.1.- GLP-1 agonist may help with weight loss. Defer to PCP.      Relevant Medications   nitroGLYCERIN (NITROSTAT) 0.4 MG SL tablet   Mild aortic stenosis by prior echocardiogram (Chronic)    AOV gradient was only 13 mmHg which is more in the category of mild, but with the amount of Calcification may very well be having to wear moderate.  We will continue to monitor.    Plan to recheck echo in 2 years.      Relevant Medications   nitroGLYCERIN  (NITROSTAT) 0.4 MG SL tablet   Essential hypertension (Chronic)    Borderline BP today on losartan and amlodipine.  Neck step would be to increase amlodipine to 5 mg daily.  We do not have heart rate room to use beta-blocker.      Relevant Medications   nitroGLYCERIN (NITROSTAT) 0.4 MG SL tablet   Hyperlipidemia with target LDL less than 70 (Chronic)    LDL 72.Relatively close to goal of <70.  He is only taking 5 mg of rosuvastatin.  Low threshold to increase dose to 10 mg.   Need to see how he does with better glycemic control. Also has elevated triglycerides.  Wait for reevaluation and consider Vascepa.        Relevant Medications   nitroGLYCERIN (NITROSTAT) 0.4 MG SL tablet     Other   Metabolic syndrome (Chronic)    Elevated triglycerides along with HTN, HLD and DM-2 NutraTear for metabolic syndrome.  This goes along with Coronary Calcium Score that is elevated as well as calcium Tatian of the aortic valve.  Aggressive risk factor modification is noted.      OSA (obstructive sleep apnea) (Chronic)    Continue to encourage using CPAP.      ===================================  HPI:    [name] is a 81 y.o. male with a cardiology problem list below who presents today for 54-month follow-up to discuss results of his coronary CT angiogram and  echocardiogram.. Problem List Items Addressed This Visit       Cardiology Problems   Coronary artery disease involving native coronary artery of native heart (Chronic)   Mild aortic stenosis by prior echocardiogram - Primary (Chronic)   Essential hypertension (Chronic)   Hyperlipidemia with target LDL less than 70 (Chronic)   Metabolic syndrome (Chronic)     [name] was seen on 04/28/2022 at the request of Dr. Eula ListenHussain for evaluation of Mild Aortic Stenosis and Coronary Calcification.  He noted off-and-on chest pain lasting anywhere from 1 to 2 minutes up to 5 to 10 minutes.  Usually shorter.  2 part symptoms with localized  jabbing pain occurring first that lasts maybe about half a minute and then more generalized sensation across the chest lasting 3 to 5 minutes at most.  May or may not happen with exertion.  He does have some mild exertional dyspnea but is somewhat deconditioned.  He is trying [facility] 3 [facility] to walk on the treadmill and track.  Also does calisthenics.  Limited more by right knee pain.  Also noted off-and-on palpitations lasting a minute or so.  Sometimes feels like it last about 30-40 beats.  Overall less energy-more sedentary. Ordered coronary CTA and TTE  Recent Hospitalizations: None  Reviewed  CV studies:    The following studies were reviewed today: (if available, images/films reviewed: From Epic Chart or Care Everywhere) Coronary CTA 05/19/2022: Coronary Calcium Score 2127.  CAD RADS 4 concerning for obstructive disease in the LAD and LCx.  Also calcified trileaflet aortic valve with a calcium score of 1428 consistent with moderate AS. FFR ct positive for distal LAD and small OM branch.  Medical management warranted. TTE (06/09/2022): EF 55 to 60%.  Normal LV size and function.  No RWMA.  Normal RV size and function.  Mildly dilated LA.  Mild MR.  Severe AOV calcification mild to moderate AS.(Mean gradient ~13 mmHg)  Normal RAP.  Interval History:   [name] returns here today overall pretty well.  He is still very active doing 3 miles on the treadmill with no chest pain or pressure.  Despite that he still has this weird off-and-on symptoms of chest discomfort usually associated with more stress and low sleep.  Not as prominent and not as frequent. His walking is really more limited by knee and hip pain.  He denies any real significant exertional dyspnea except when his joints are hurting him.  Palpitations are definitely less prominent.  CV Review of Symptoms (Summary) positive for - chest pain, palpitations, and both of these are usually associated with increased  stress, or poor sleep negative for - dyspnea on exertion, edema, irregular heartbeat, orthopnea, palpitations, paroxysmal nocturnal dyspnea, rapid heart rate, shortness of breath, or syncope/near syncope, TIA/amaurosis fugax, claudication.  REVIEWED OF SYSTEMS   Review of Systems  All other systems reviewed and are negative. Pertinent symptoms noted above. -> Noncardiac symptoms include right knee pain that limits his activity level.  I have reviewed and (if needed) personally updated the patient's problem list, medications, allergies, past medical and surgical history, social and family history.   PAST MEDICAL HISTORY   Past Medical History:  Diagnosis Date   Allergic rhinitis    Arthritis THUMB   Coronary artery disease, non-occlusive 05/19/2022   Coronary Calcium Score 2127.  CAD RADS 4 concerning for obstructive disease in the LAD and LCx. => FFR ct positive for distal LAD and small OM  branch.  Medical management warranted.   COVID-19 10/20/2021   Took Paxlovid   Diabetes mellitus, type 2 (HCC)    HAS "RAPID DROPS IN BLOOD SUGAR"; on metformin and glimepiride   Diverticulitis large intestine    History of Bell's palsy    Left-sided-resolved   Hyperlipidemia    -Switch to rosuvastatin 5 mg   Hypertension    On losartan 100 mg daily as well as amlodipine 2.5 mg daily.   Mild aortic stenosis by prior echocardiography 06/09/2022   Severe aortic valve calcification with mild to moderate aortic stenosis.  Mean gradient 13 mmHg. ->  Coronary Calcium Score showed AoV calcium score of 1428 c/w moderate AS   Nuclear sclerotic cataract of both eyes 03/26/2020   OSA on CPAP    10 cm H2O pressure; not using CPAP since May 2018   Psoriasis    Involving the scalp; followed by Dr. 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  Normal LV systolic function with GR 1 DD (normal for age.  Mild AS.  Mean gradient 10 mmHg.=> Follow-up echo 2022 read as stable stable EF and stable AS   VASECTOMY      Immunization History  Administered Date(s) Administered   Influenza,inj,Quad PF,6+ Mos 08/03/2013   Tdap 01/26/2018    MEDICATIONS/ALLERGIES   Current Meds  Medication Sig   acetaminophen (TYLENOL) 650 MG CR tablet Take 1,300 mg by mouth daily.   amLODipine (NORVASC) 2.5 MG tablet Take 2.5 mg by mouth daily.   glimepiride (AMARYL) 2 MG tablet Take 1 mg by mouth every morning.   ketoconazole (NIZORAL) 2 % shampoo    losartan (COZAAR) 100 MG tablet Take 100 mg by mouth every morning.    metFORMIN (GLUCOPHAGE) 1000 MG tablet Take 1,000 mg by mouth 2 (two) times daily with a meal.   nitroGLYCERIN (NITROSTAT) 0.4 MG SL tablet Place 1 tablet (0.4 mg total) under the tongue every 5 (five) minutes as needed for chest pain.   rosuvastatin (CRESTOR) 5 MG tablet Take 5 mg by mouth daily.   tamsulosin (FLOMAX) 0.4 MG CAPS capsule Take 0.4 mg by mouth at bedtime.    Allergies  Allergen Reactions   Latex Dermatitis    SOCIAL HISTORY/FAMILY HISTORY   Reviewed in Epic:  Pertinent findings:  Social History    Tobacco Use   Smoking status: Former    Packs/day: 1.00    Years: 10.00    Total pack years: 10.00    Types: Cigarettes    Quit date: 07/27/1979  Years since quitting: 42.9   Smokeless tobacco: Never  Substance Use Topics   Alcohol use: No   Drug use: No   Social History   Social History Narrative   Married father 6: Fibroids, 1 daughter with peritoneal cancer (still lives in [place].   He himself is a native [place].   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      Walks about 4 days a week 3 miles at a time..   Former smoker quit 1980.   Has a couple cups of coffee but no significant amount of caffeine    OBJCTIVE -PE, EKG, labs   Wt Readings from Last 3 Encounters:  07/12/22 167 lb 9.6 oz (76 kg)  04/28/22 166 lb 12.8 oz (75.7 kg)  01/25/18 166 lb (75.3 kg)    Physical Exam: BP 132/80   Pulse 63   Ht 5\' 5"  (1.651 m)   Wt 167 lb 9.6 oz (76 kg)   SpO2 97%   BMI 27.89 kg/m  Physical Exam Vitals reviewed.  Constitutional:      General: He is not in acute distress.    Appearance: Normal appearance. He is normal weight. He is not ill-appearing or toxic-appearing.     Comments: Well-nourished and well-groomed.  Mild truncal obesity but does not meet obese criteria.  HENT:     Head: Normocephalic and atraumatic.  Neck:     Vascular: No carotid bruit or JVD.  Cardiovascular:     Rate and Rhythm: Normal rate and regular rhythm. Occasional Extrasystoles are present.    Chest Wall: PMI is not displaced.     Pulses: Normal pulses.     Heart sounds: S1 normal and S2 normal. Heart sounds are distant. Murmur (Harsh 2/6 SEM RUSB-neck) heard.     No friction rub. No gallop.  Pulmonary:     Effort: Pulmonary effort is normal. No respiratory distress.     Breath sounds: Normal breath sounds. No wheezing or rales.  Chest:     Chest wall: No tenderness.  Musculoskeletal:        General: No swelling. Normal range of motion.     Cervical back: Normal  range of motion and neck supple.  Skin:    General: Skin is warm and dry.     Findings: No rash.  Neurological:     General: No focal deficit present.     Mental Status: He is alert and oriented to person, place, and time.     Gait: Gait normal.  Psychiatric:        Behavior: Behavior normal.        Thought Content: Thought content normal.        Judgment: Judgment normal.    Adult ECG Report Not checked  Recent Labs:   04/16/2022: TC 159, TG 268, HDL 44, LDL 72.  A1c 7.1.  CR 1.0.  K+ 4.3.  Hgb 14.2.  TSH 1.67. No results found for: "CHOL", "HDL", "LDLCALC", "LDLDIRECT", "TRIG", "CHOLHDL" Lab Results  Component Value Date   CREATININE 1.07 01/25/2018   BUN 16 01/25/2018   NA 135 01/25/2018   K 3.9 01/25/2018   CL 99 (L) 01/25/2018   CO2 23 01/25/2018      Latest Ref Rng & Units 01/25/2018   10:05 PM 08/02/2014    4:57 AM 08/01/2014    4:28 AM  CBC  WBC 4.0 - 10.5 K/uL 17.4  10.3  13.8   Hemoglobin 13.0 - 17.0 g/dL 10/01/2014  11.0  11.3   Hematocrit 39.0 - 52.0 % 41.4  33.7  34.2   Platelets 150 - 400 K/uL 204  213  249     No results found for: "HGBA1C" No results found for: "TSH"  ================================================== I spent a total of 36 minutes with the patient spent in direct patient consultation.  Additional time spent with chart review  / charting (studies, outside notes, etc): 20 min Total Time: 56 min  Current medicines are reviewed at length with the patient today.  (+/- concerns) none  Notice: This dictation was prepared with Dragon dictation along with smart phrase technology. Any transcriptional errors that result from this process are unintentional and may not be corrected upon review.  Studies Ordered:   No orders of the defined types were placed in this encounter.  Meds ordered this encounter  Medications   nitroGLYCERIN (NITROSTAT) 0.4 MG SL tablet    Sig: Place 1 tablet (0.4 mg total) under the tongue every 5 (five) minutes as needed  for chest pain.    Dispense:  25 tablet    Refill:  6    Patient Instructions / Medication Changes & Studies & Tests Ordered   Patient Instructions  Medication Instructions:  Use  Nitroglycerin tabs-  as needed for chest discomfort    Recommend you discuss with primary  Dr Eula Listen - about starting one of these medication for your diabetes- Ozempic, Farxiga, or Jardiance  *If you need a refill on your cardiac medications before your next appointment, please call your pharmacy*   Lab Work: Not needed    Testing/Procedures: Not needed   Follow-Up: At [facility], you and your health needs are our priority.  As part of our continuing mission to provide you with exceptional heart care, we have created designated Provider Care Teams.  These Care Teams include your primary Cardiologist (physician) and Advanced Practice Providers (APPs -  Physician Assistants and Nurse Practitioners) who all work together to provide you with the care you need, when you need it.    Your next appointment:   12 month(s)  The format for your next appointment:   In Person  Provider:   Bryan Lemma, MD     Other Instructions   Important Information About Sugar           Marykay Lex, MD, MS Bryan Lemma, M.D., M.S. Interventional Cardiologist  [facility]  Pager # [phone] Phone # [phone] [address] [place], [place] [address]   Thank you for choosing [facility] at [place]!!

## 2022-07-12 NOTE — Patient Instructions (Signed)
Medication Instructions:  Use  Nitroglycerin tabs-  as needed for chest discomfort    Recommend you discuss with primary  Dr Eula Listen - about starting one of these medication for your diabetes- Ozempic, Farxiga, or Jardiance  *If you need a refill on your cardiac medications before your next appointment, please call your pharmacy*   Lab Work: Not needed    Testing/Procedures: Not needed   Follow-Up: At [facility], you and your health needs are our priority.  As part of our continuing mission to provide you with exceptional heart care, we have created designated Provider Care Teams.  These Care Teams include your primary Cardiologist (physician) and Advanced Practice Providers (APPs -  Physician Assistants and Nurse Practitioners) who all work together to provide you with the care you need, when you need it.    Your next appointment:   12 month(s)  The format for your next appointment:   In Person  Provider:   Bryan Lemma, MD     Other Instructions   Important Information About Sugar

## 2022-07-14 ENCOUNTER — Encounter (INDEPENDENT_AMBULATORY_CARE_PROVIDER_SITE_OTHER): Payer: Medicare PPO | Admitting: Ophthalmology

## 2022-07-15 ENCOUNTER — Encounter: Payer: Self-pay | Admitting: Cardiology

## 2022-07-15 NOTE — Assessment & Plan Note (Signed)
Elevated triglycerides along with HTN, HLD and DM-2 NutraTear for metabolic syndrome.  This goes along with Coronary Calcium Score that is elevated as well as calcium Tatian of the aortic valve.  Aggressive risk factor modification is noted.

## 2022-07-15 NOTE — Assessment & Plan Note (Signed)
Continue to encourage using CPAP.

## 2022-07-15 NOTE — Assessment & Plan Note (Signed)
LDL 72.Relatively close to goal of <70.  He is only taking 5 mg of rosuvastatin.  Low threshold to increase dose to 10 mg.   Need to see how he does with better glycemic control. Also has elevated triglycerides.  Wait for reevaluation and consider Vascepa.

## 2022-07-15 NOTE — Assessment & Plan Note (Signed)
AOV gradient was only 13 mmHg which is more in the category of mild, but with the amount of Calcification may very well be having to wear moderate.  We will continue to monitor.    Plan to recheck echo in 2 years.

## 2022-07-15 NOTE — Assessment & Plan Note (Addendum)
Pretty high Coronary Calcium Score with disease in the LAD and LCx.  Distal LAD with CT FFR positive as well as small OM branch.  The LAD lesions would probably be sequential diffuse lesions without target for PCI based on findings.  OM 2 small for PCI.  These findings go along with the his symptoms.  He really does not have true anginal type chest discomfort.  Not exertional with presignificant mount of exertion.  Chest discomfort seems to be more musculoskeletal in nature.  Continue aggressive risk factor modification.  BP pretty well controlled,   we do have room to potentially increase amlodipine dose.  Monitor closely.  Continue losartan.  Target LDL should be less than 70   (currently 72 on low-dose rosuvastatin. => Low threshold to increase to 10 mg.  He is not taking a daily aspirin, but he takes 325 mg just but every other day for back pain.    I have asked that he take 81 mg on the days he does not take a full dose.  Consider SGLT2 inhibitor versus GLP-1 agonist with A1c of 7.1.-  GLP-1 agonist may help with weight loss.  Defer to PCP.

## 2022-07-15 NOTE — Assessment & Plan Note (Signed)
Borderline BP today on losartan and amlodipine.  Neck step would be to increase amlodipine to 5 mg daily.  We do not have heart rate room to use beta-blocker.

## 2022-08-03 DIAGNOSIS — M5416 Radiculopathy, lumbar region: Secondary | ICD-10-CM | POA: Diagnosis not present

## 2022-08-12 DIAGNOSIS — I2510 Atherosclerotic heart disease of native coronary artery without angina pectoris: Secondary | ICD-10-CM | POA: Diagnosis not present

## 2022-08-12 DIAGNOSIS — E1169 Type 2 diabetes mellitus with other specified complication: Secondary | ICD-10-CM | POA: Diagnosis not present

## 2022-08-12 DIAGNOSIS — E782 Mixed hyperlipidemia: Secondary | ICD-10-CM | POA: Diagnosis not present

## 2022-08-12 DIAGNOSIS — I350 Nonrheumatic aortic (valve) stenosis: Secondary | ICD-10-CM | POA: Diagnosis not present

## 2022-08-12 DIAGNOSIS — I10 Essential (primary) hypertension: Secondary | ICD-10-CM | POA: Diagnosis not present

## 2022-08-27 DIAGNOSIS — M48062 Spinal stenosis, lumbar region with neurogenic claudication: Secondary | ICD-10-CM | POA: Diagnosis not present

## 2022-08-27 DIAGNOSIS — M47816 Spondylosis without myelopathy or radiculopathy, lumbar region: Secondary | ICD-10-CM | POA: Diagnosis not present

## 2022-09-03 ENCOUNTER — Ambulatory Visit: Payer: Medicare PPO | Admitting: Cardiology

## 2022-09-16 DIAGNOSIS — M47816 Spondylosis without myelopathy or radiculopathy, lumbar region: Secondary | ICD-10-CM | POA: Diagnosis not present

## 2022-10-11 DIAGNOSIS — M47816 Spondylosis without myelopathy or radiculopathy, lumbar region: Secondary | ICD-10-CM | POA: Diagnosis not present

## 2022-11-12 DIAGNOSIS — I2510 Atherosclerotic heart disease of native coronary artery without angina pectoris: Secondary | ICD-10-CM | POA: Diagnosis not present

## 2022-11-12 DIAGNOSIS — I10 Essential (primary) hypertension: Secondary | ICD-10-CM | POA: Diagnosis not present

## 2022-11-12 DIAGNOSIS — N400 Enlarged prostate without lower urinary tract symptoms: Secondary | ICD-10-CM | POA: Diagnosis not present

## 2022-11-12 DIAGNOSIS — E782 Mixed hyperlipidemia: Secondary | ICD-10-CM | POA: Diagnosis not present

## 2022-11-12 DIAGNOSIS — I350 Nonrheumatic aortic (valve) stenosis: Secondary | ICD-10-CM | POA: Diagnosis not present

## 2022-11-12 DIAGNOSIS — E1169 Type 2 diabetes mellitus with other specified complication: Secondary | ICD-10-CM | POA: Diagnosis not present

## 2023-01-27 ENCOUNTER — Encounter (INDEPENDENT_AMBULATORY_CARE_PROVIDER_SITE_OTHER): Payer: Medicare PPO | Admitting: Ophthalmology

## 2023-03-17 DIAGNOSIS — L821 Other seborrheic keratosis: Secondary | ICD-10-CM | POA: Diagnosis not present

## 2023-03-17 DIAGNOSIS — B353 Tinea pedis: Secondary | ICD-10-CM | POA: Diagnosis not present

## 2023-03-17 DIAGNOSIS — D225 Melanocytic nevi of trunk: Secondary | ICD-10-CM | POA: Diagnosis not present

## 2023-03-17 DIAGNOSIS — L218 Other seborrheic dermatitis: Secondary | ICD-10-CM | POA: Diagnosis not present

## 2023-03-17 DIAGNOSIS — Z09 Encounter for follow-up examination after completed treatment for conditions other than malignant neoplasm: Secondary | ICD-10-CM | POA: Diagnosis not present

## 2023-03-17 DIAGNOSIS — Z872 Personal history of diseases of the skin and subcutaneous tissue: Secondary | ICD-10-CM | POA: Diagnosis not present

## 2023-03-17 DIAGNOSIS — L814 Other melanin hyperpigmentation: Secondary | ICD-10-CM | POA: Diagnosis not present

## 2023-03-17 DIAGNOSIS — L570 Actinic keratosis: Secondary | ICD-10-CM | POA: Diagnosis not present

## 2023-03-26 DIAGNOSIS — H3530 Unspecified macular degeneration: Secondary | ICD-10-CM | POA: Diagnosis not present

## 2023-03-26 DIAGNOSIS — I25119 Atherosclerotic heart disease of native coronary artery with unspecified angina pectoris: Secondary | ICD-10-CM | POA: Diagnosis not present

## 2023-03-26 DIAGNOSIS — G4733 Obstructive sleep apnea (adult) (pediatric): Secondary | ICD-10-CM | POA: Diagnosis not present

## 2023-03-26 DIAGNOSIS — I10 Essential (primary) hypertension: Secondary | ICD-10-CM | POA: Diagnosis not present

## 2023-03-26 DIAGNOSIS — E785 Hyperlipidemia, unspecified: Secondary | ICD-10-CM | POA: Diagnosis not present

## 2023-03-26 DIAGNOSIS — K219 Gastro-esophageal reflux disease without esophagitis: Secondary | ICD-10-CM | POA: Diagnosis not present

## 2023-03-26 DIAGNOSIS — M1990 Unspecified osteoarthritis, unspecified site: Secondary | ICD-10-CM | POA: Diagnosis not present

## 2023-03-26 DIAGNOSIS — M5430 Sciatica, unspecified side: Secondary | ICD-10-CM | POA: Diagnosis not present

## 2023-03-26 DIAGNOSIS — F419 Anxiety disorder, unspecified: Secondary | ICD-10-CM | POA: Diagnosis not present

## 2023-04-20 DIAGNOSIS — M5136 Other intervertebral disc degeneration, lumbar region: Secondary | ICD-10-CM | POA: Diagnosis not present

## 2023-04-20 DIAGNOSIS — E1169 Type 2 diabetes mellitus with other specified complication: Secondary | ICD-10-CM | POA: Diagnosis not present

## 2023-04-20 DIAGNOSIS — E782 Mixed hyperlipidemia: Secondary | ICD-10-CM | POA: Diagnosis not present

## 2023-04-20 DIAGNOSIS — I350 Nonrheumatic aortic (valve) stenosis: Secondary | ICD-10-CM | POA: Diagnosis not present

## 2023-04-20 DIAGNOSIS — N400 Enlarged prostate without lower urinary tract symptoms: Secondary | ICD-10-CM | POA: Diagnosis not present

## 2023-04-20 DIAGNOSIS — G4733 Obstructive sleep apnea (adult) (pediatric): Secondary | ICD-10-CM | POA: Diagnosis not present

## 2023-04-20 DIAGNOSIS — I2510 Atherosclerotic heart disease of native coronary artery without angina pectoris: Secondary | ICD-10-CM | POA: Diagnosis not present

## 2023-04-20 DIAGNOSIS — I10 Essential (primary) hypertension: Secondary | ICD-10-CM | POA: Diagnosis not present

## 2023-04-20 DIAGNOSIS — Z7984 Long term (current) use of oral hypoglycemic drugs: Secondary | ICD-10-CM | POA: Diagnosis not present

## 2023-04-20 DIAGNOSIS — Z0000 Encounter for general adult medical examination without abnormal findings: Secondary | ICD-10-CM | POA: Diagnosis not present

## 2023-04-20 DIAGNOSIS — E119 Type 2 diabetes mellitus without complications: Secondary | ICD-10-CM | POA: Diagnosis not present

## 2023-05-18 DIAGNOSIS — E119 Type 2 diabetes mellitus without complications: Secondary | ICD-10-CM | POA: Diagnosis not present

## 2023-05-18 DIAGNOSIS — H04123 Dry eye syndrome of bilateral lacrimal glands: Secondary | ICD-10-CM | POA: Diagnosis not present

## 2023-05-18 DIAGNOSIS — H53031 Strabismic amblyopia, right eye: Secondary | ICD-10-CM | POA: Diagnosis not present

## 2023-05-18 DIAGNOSIS — Z961 Presence of intraocular lens: Secondary | ICD-10-CM | POA: Diagnosis not present

## 2023-05-18 DIAGNOSIS — H43811 Vitreous degeneration, right eye: Secondary | ICD-10-CM | POA: Diagnosis not present

## 2023-05-18 DIAGNOSIS — H26493 Other secondary cataract, bilateral: Secondary | ICD-10-CM | POA: Diagnosis not present

## 2023-07-10 NOTE — Progress Notes (Signed)
Cardiology Office Note:  .   Date:  07/10/2023  ID:  [name], DOB 1941/09/15, MRN [id number] PCP: Georgann Housekeeper, MD  [facility] Providers Cardiologist:  Bryan Lemma, MD =   History of Present Illness: .   [name] is a 82 y.o. male with past medical history of CAD, hypertension, mild aortic stenosis, OSA, type 2 diabetes, hyperlipidemia.  Patient is followed by Dr. Herbie Baltimore and presents today for an annual follow-up visit.  Patient establish care with Dr. Herbie Baltimore in 04/2022 for evaluation of cardiac murmur and intermittent chest pain.  Patient had previously undergone echocardiogram in 11/2015 that had shown mild aortic stenosis.  Patient underwent coronary CTA on 05/2022 that showed a coronary calcium score of 2127.  There was a calcified trileaflet aortic valve consistent with moderate aortic stenosis. There was concern for obstructive CAD in the LAD and left circumflex and study was sent for FFR.  CT FFR was positive but only in the distal LAD and small OM2 branch.  As patient's symptoms were stable, recommendation was to attempt aggressive risk factor modification.  Echocardiogram on 06/29/2022 showed EF of 55-60%, no regional wall motion abnormalities, normal RV function, mild mitral valve regurgitation, severe calcification of the aortic valve with mild-moderate aortic valve stenosis.  Mean gradient 13 mmHg.  Patient was last seen by Dr. Herbie Baltimore on 07/12/2022.  At that time, patient's blood pressure was well-controlled.  Patient was started on aspirin 81 mg daily at that time.  He was taking Crestor 5 mg daily, and there was a low threshold to increase to 10 mg daily.  Coronary artery disease -Patient underwent coronary CTA on 05/19/2022.  Found to have a coronary calcium score of 2127 and there was disease in the LAD and left circumflex.  Study was sent for FFR, and FFR was positive in the distal LAD and a small OM branch.  Per Dr. Herbie Baltimore, LAD lesion was a poor target for PCI  and OM was too small for PCI -  -Continue aspirin 81 mg daily -Continue amlodipine 2.5 mg daily -Currently on Crestor 5 mg daily ***  Hypertension - BP well controlled on current medications   Hyperlipidemia  Mild/moderate aortic stenosis Mild aortic regurgitation  Mild Mitral Regurgitation  -Echocardiogram from 06/2022 showed severe calcification of the aortic valve with mild-moderate aortic valve stenosis.  Also noted to have mild mitral valve regurgitation, mild aortic valve regurgitation -  - Plan to repeat echocardiogram in 07/2024   OSA  Type 2 diabetes -Managed by PCP.  Currently on metformin 1000 mg twice daily   ROS: ***  Studies Reviewed: .        *** Risk Assessment/Calculations:   {Does this patient have ATRIAL FIBRILLATION?:[phone]} No BP recorded.  {Refresh Note OR Click here to enter BP  :1}***       Physical Exam:   VS:  There were no vitals taken for this visit.   Wt Readings from Last 3 Encounters:  07/12/22 167 lb 9.6 oz (76 kg)  04/28/22 166 lb 12.8 oz (75.7 kg)  01/25/18 166 lb (75.3 kg)    GEN: Well nourished, well developed in no acute distress NECK: No JVD; No carotid bruits CARDIAC: ***RRR, no murmurs, rubs, gallops RESPIRATORY:  Clear to auscultation without rales, wheezing or rhonchi  ABDOMEN: Soft, non-tender, non-distended EXTREMITIES:  No edema; No deformity   ASSESSMENT AND PLAN: .   ***    {Are you ordering a CV Procedure (e.g. stress test, cath, DCCV, TEE,  etc)?   Press F2        :[id number]}  Dispo: ***  Signed, Jonita Albee, PA-C

## 2023-07-14 ENCOUNTER — Encounter: Payer: Self-pay | Admitting: Cardiology

## 2023-07-14 ENCOUNTER — Ambulatory Visit: Payer: Medicare PPO | Attending: Cardiology | Admitting: Cardiology

## 2023-07-14 VITALS — BP 124/66 | HR 50 | Ht 65.0 in | Wt 156.0 lb

## 2023-07-14 DIAGNOSIS — I350 Nonrheumatic aortic (valve) stenosis: Secondary | ICD-10-CM

## 2023-07-14 DIAGNOSIS — I2510 Atherosclerotic heart disease of native coronary artery without angina pectoris: Secondary | ICD-10-CM

## 2023-07-14 DIAGNOSIS — R001 Bradycardia, unspecified: Secondary | ICD-10-CM

## 2023-07-14 DIAGNOSIS — E785 Hyperlipidemia, unspecified: Secondary | ICD-10-CM

## 2023-07-14 DIAGNOSIS — E119 Type 2 diabetes mellitus without complications: Secondary | ICD-10-CM

## 2023-07-14 DIAGNOSIS — I10 Essential (primary) hypertension: Secondary | ICD-10-CM

## 2023-07-14 DIAGNOSIS — Z7984 Long term (current) use of oral hypoglycemic drugs: Secondary | ICD-10-CM

## 2023-07-14 NOTE — Patient Instructions (Addendum)
Medication Instructions:  Your physician recommends that you continue on your current medications as directed. Please refer to the Current Medication list given to you today.  *If you need a refill on your cardiac medications before your next appointment, please call your pharmacy*   Lab Work: NONE ordered at this time of appointment   Testing/Procedures: NONE ordered at this time of appointment     Follow-Up: At [facility], you and your health needs are our priority.  As part of our continuing mission to provide you with exceptional heart care, we have created designated Provider Care Teams.  These Care Teams include your primary Cardiologist (physician) and Advanced Practice Providers (APPs -  Physician Assistants and Nurse Practitioners) who all work together to provide you with the care you need, when you need it.  We recommend signing up for the patient portal called "MyChart".  Sign up information is provided on this After Visit Summary.  MyChart is used to connect with patients for Virtual Visits (Telemedicine).  Patients are able to view lab/test results, encounter notes, upcoming appointments, etc.  Non-urgent messages can be sent to your provider as well.   To learn more about what you can do with MyChart, go to [email or web address].    Your next appointment:   1 year(s)  Provider:   Robet Leu, PA-C

## 2023-08-23 DIAGNOSIS — M79645 Pain in left finger(s): Secondary | ICD-10-CM | POA: Diagnosis not present

## 2023-08-23 DIAGNOSIS — M25561 Pain in right knee: Secondary | ICD-10-CM | POA: Diagnosis not present

## 2023-08-23 DIAGNOSIS — M79644 Pain in right finger(s): Secondary | ICD-10-CM | POA: Diagnosis not present

## 2023-10-03 ENCOUNTER — Other Ambulatory Visit: Payer: Self-pay | Admitting: Cardiology

## 2023-10-19 DIAGNOSIS — I2510 Atherosclerotic heart disease of native coronary artery without angina pectoris: Secondary | ICD-10-CM | POA: Diagnosis not present

## 2023-10-19 DIAGNOSIS — Z9989 Dependence on other enabling machines and devices: Secondary | ICD-10-CM | POA: Diagnosis not present

## 2023-10-19 DIAGNOSIS — I10 Essential (primary) hypertension: Secondary | ICD-10-CM | POA: Diagnosis not present

## 2023-10-19 DIAGNOSIS — G4733 Obstructive sleep apnea (adult) (pediatric): Secondary | ICD-10-CM | POA: Diagnosis not present

## 2023-10-19 DIAGNOSIS — E782 Mixed hyperlipidemia: Secondary | ICD-10-CM | POA: Diagnosis not present

## 2023-10-19 DIAGNOSIS — M519 Unspecified thoracic, thoracolumbar and lumbosacral intervertebral disc disorder: Secondary | ICD-10-CM | POA: Diagnosis not present

## 2023-10-19 DIAGNOSIS — I350 Nonrheumatic aortic (valve) stenosis: Secondary | ICD-10-CM | POA: Diagnosis not present

## 2023-10-19 DIAGNOSIS — E1169 Type 2 diabetes mellitus with other specified complication: Secondary | ICD-10-CM | POA: Diagnosis not present

## 2023-10-19 DIAGNOSIS — N400 Enlarged prostate without lower urinary tract symptoms: Secondary | ICD-10-CM | POA: Diagnosis not present

## 2023-10-19 DIAGNOSIS — E119 Type 2 diabetes mellitus without complications: Secondary | ICD-10-CM | POA: Diagnosis not present

## 2024-04-12 DIAGNOSIS — N400 Enlarged prostate without lower urinary tract symptoms: Secondary | ICD-10-CM | POA: Diagnosis not present

## 2024-04-12 DIAGNOSIS — G4733 Obstructive sleep apnea (adult) (pediatric): Secondary | ICD-10-CM | POA: Diagnosis not present

## 2024-04-12 DIAGNOSIS — Z0000 Encounter for general adult medical examination without abnormal findings: Secondary | ICD-10-CM | POA: Diagnosis not present

## 2024-04-12 DIAGNOSIS — I350 Nonrheumatic aortic (valve) stenosis: Secondary | ICD-10-CM | POA: Diagnosis not present

## 2024-04-12 DIAGNOSIS — Z1331 Encounter for screening for depression: Secondary | ICD-10-CM | POA: Diagnosis not present

## 2024-04-12 DIAGNOSIS — E782 Mixed hyperlipidemia: Secondary | ICD-10-CM | POA: Diagnosis not present

## 2024-04-12 DIAGNOSIS — E1169 Type 2 diabetes mellitus with other specified complication: Secondary | ICD-10-CM | POA: Diagnosis not present

## 2024-04-12 DIAGNOSIS — I10 Essential (primary) hypertension: Secondary | ICD-10-CM | POA: Diagnosis not present

## 2024-04-12 DIAGNOSIS — M519 Unspecified thoracic, thoracolumbar and lumbosacral intervertebral disc disorder: Secondary | ICD-10-CM | POA: Diagnosis not present

## 2024-04-12 DIAGNOSIS — I2510 Atherosclerotic heart disease of native coronary artery without angina pectoris: Secondary | ICD-10-CM | POA: Diagnosis not present

## 2024-04-12 DIAGNOSIS — L409 Psoriasis, unspecified: Secondary | ICD-10-CM | POA: Diagnosis not present

## 2024-04-12 DIAGNOSIS — Z23 Encounter for immunization: Secondary | ICD-10-CM | POA: Diagnosis not present

## 2024-05-23 DIAGNOSIS — H26493 Other secondary cataract, bilateral: Secondary | ICD-10-CM | POA: Diagnosis not present

## 2024-05-23 DIAGNOSIS — H04123 Dry eye syndrome of bilateral lacrimal glands: Secondary | ICD-10-CM | POA: Diagnosis not present

## 2024-05-23 DIAGNOSIS — H43811 Vitreous degeneration, right eye: Secondary | ICD-10-CM | POA: Diagnosis not present

## 2024-05-23 DIAGNOSIS — Z961 Presence of intraocular lens: Secondary | ICD-10-CM | POA: Diagnosis not present

## 2024-05-23 DIAGNOSIS — E119 Type 2 diabetes mellitus without complications: Secondary | ICD-10-CM | POA: Diagnosis not present

## 2024-05-23 DIAGNOSIS — H53031 Strabismic amblyopia, right eye: Secondary | ICD-10-CM | POA: Diagnosis not present

## 2024-06-06 DIAGNOSIS — H26492 Other secondary cataract, left eye: Secondary | ICD-10-CM | POA: Diagnosis not present

## 2024-06-06 DIAGNOSIS — E119 Type 2 diabetes mellitus without complications: Secondary | ICD-10-CM | POA: Diagnosis not present

## 2024-07-11 ENCOUNTER — Encounter: Payer: Self-pay | Admitting: *Deleted

## 2024-07-13 ENCOUNTER — Encounter: Payer: Self-pay | Admitting: Emergency Medicine

## 2024-07-13 ENCOUNTER — Ambulatory Visit: Attending: Cardiology | Admitting: Emergency Medicine

## 2024-07-13 VITALS — BP 116/70 | HR 60 | Ht 65.0 in | Wt 157.0 lb

## 2024-07-13 DIAGNOSIS — E119 Type 2 diabetes mellitus without complications: Secondary | ICD-10-CM

## 2024-07-13 DIAGNOSIS — I350 Nonrheumatic aortic (valve) stenosis: Secondary | ICD-10-CM | POA: Diagnosis not present

## 2024-07-13 DIAGNOSIS — E785 Hyperlipidemia, unspecified: Secondary | ICD-10-CM | POA: Diagnosis not present

## 2024-07-13 DIAGNOSIS — I10 Essential (primary) hypertension: Secondary | ICD-10-CM

## 2024-07-13 DIAGNOSIS — I2510 Atherosclerotic heart disease of native coronary artery without angina pectoris: Secondary | ICD-10-CM

## 2024-07-13 NOTE — Patient Instructions (Signed)
 Medication Instructions:  NO CHANGES  Lab Work: NONE TO BE DONE TODAY.   Testing/Procedures: TO BE DONE IN 1-2 MONTHS  Your physician has requested that you have an ECHOCARDIOGRAM. Echocardiography is a painless test that uses sound waves to create images of your heart. It provides your doctor with information about the size and shape of your heart and how well your heart's chambers and valves are working. This procedure takes approximately one hour. There are no restrictions for this procedure. Please do NOT wear cologne, perfume, aftershave, or lotions (deodorant is allowed). Please arrive 15 minutes prior to your appointment time.  Please note: We ask at that you not bring children with you during ultrasound (echo/ vascular) testing. Due to room size and safety concerns, children are not allowed in the ultrasound rooms during exams. Our front office staff cannot provide observation of children in our lobby area while testing is being conducted. An adult accompanying a patient to their appointment will only be allowed in the ultrasound room at the discretion of the ultrasound technician under special circumstances. We apologize for any inconvenience.   Follow-Up: At [facility], you and your health needs are our priority.  As part of our continuing mission to provide you with exceptional heart care, our providers are all part of one team.  This team includes your primary Cardiologist (physician) and Advanced Practice Providers or APPs (Physician Assistants and Nurse Practitioners) who all work together to provide you with the care you need, when you need it.  Your next appointment:   1 YEAR  Provider:   Alm Clay, MD OR Lum Louis, NP

## 2024-07-13 NOTE — Progress Notes (Signed)
 Cardiology Office Note:    Date:  07/13/2024  ID:  [name], DOB 11/06/1940, MRN [id number] PCP: William Other, MD  [facility] Providers Cardiologist:  William Clay, MD Cardiology APP:  [name] CROME, NP       Patient Profile:       Chief Complaint: 1 year follow-up History of Present Illness:  [name] is a 83 y.o. male with visit-pertinent history of coronary artery disease, hypertension, mild aortic stenosis, OSA, type 2 diabetes, hyperlipidemia.  Patient establish care with Dr. Clay in 04/2022 for evaluation of cardiac murmur and intermittent chest pains.  Patient had previously undergone echocardiogram in 11/2015 that showed mild aortic stenosis.  Patient underwent coronary CTA on 05/2022 that showed a coronary calcium score of 2127.  There was a calcified trileaflet aortic valve consistent with moderate aortic stenosis.  There was concern for obstructive CAD in the LAD and left circumflex and the study was sent to FFR.  CT FFR is positive to the distal LAD and small OM 2 branch.  As patient's symptoms were stable, recommendation was to attempt aggressive risk factor modification.  He underwent echocardiogram on 8/29 2023 with LVEF 55 to 60%, no RWMA, normal RV function, mild mitral valve regurgitation, severe calcification of aortic valve with mild-moderate aortic valve stenosis, mean gradient 13 mmHg.  He was last seen in clinic on 07/14/2023 by [name], William.  He was doing well and remained very active going to the gym 4-5 days/week.  He was without any exertional chest pains.  He was continued on current medication regimen and to follow-up in 1 year.   Discussed the use of AI scribe software for clinical note transcription with the patient, who gave verbal consent to proceed.  History of Present Illness [name] is an 83 year old male with coronary artery disease, sinus bradycardia, hypertension, high cholesterol, and aortic stenosis who presents for  follow-up of his cardiovascular conditions.  Today patient is doing well overall.  He is without any acute cardiovascular concerns or complaints.  He maintains an active lifestyle, attending the gym four times a week, walking a mile and a half, using the elliptical machine, and rowing. He monitors his heart rate during exercise, keeping it below 110 bpm, and experiences no chest pain or shortness of breath.  Denies orthopnea, PND, lower extremity swelling.   He experiences lightheadedness or dizziness when standing quickly from a squatting position, but not from sitting to standing.  This has been ongoing for several years with no acute changes.  Blood pressure average after exercise is 100/60 mmHg with a pulse of 90 bpm.  Denies syncope or presyncope.  No palpitations or falls.  He takes amlodipine at night and rosuvastatin, with cholesterol last recorded at 70 mg/dL.   Review of systems:  Please see the history of present illness. All [name] systems are reviewed and otherwise negative.      Studies Reviewed:    EKG Interpretation Date/Time:  Friday July 13 2024 14:59:10 EDT Ventricular Rate:  60 PR Interval:  176 QRS Duration:  86 QT Interval:  382 QTC Calculation: 382 R Axis:   14  Text Interpretation: Normal sinus rhythm Normal ECG When compared with ECG of 14-Jul-2023 08:20, No significant change was found Confirmed by [name] [phone]) on 07/13/2024 4:21:09 PM    Echocardiogram 06/29/2022  1. Left ventricular ejection fraction, by estimation, is 55 to 60%. The  left ventricle has normal function. The left ventricle has no regional  wall motion abnormalities. Left ventricular diastolic parameters were  normal.   2. Right ventricular systolic function is normal. The right ventricular  size is normal. There is normal pulmonary artery systolic pressure.   3. Left atrial size was mildly dilated.   4. The mitral valve is abnormal. Mild mitral valve regurgitation. No   evidence of mitral stenosis.   5. The aortic valve is tricuspid. There is severe calcifcation of the  aortic valve. There is severe thickening of the aortic valve. Aortic valve  regurgitation is mild. Mild to moderate aortic valve stenosis.   6. The inferior vena cava is normal in size with greater than 50%  respiratory variability, suggesting right atrial pressure of 3 mmHg.   Coronary CTA 05/19/2022 1.  LM/3 vessel calcium score 2127   2. CAD RADS 4 concern for obstructive CAD in LAD and LCX/OM Study sent for FFR CT   3.  Normal ascending thoracic aorta 3.1 cm   4. Calcified tri leaflet AV with score 1428 more consistent with moderate AS  FFR CT positive but only in the distal LAD and small OM2 branch If clinically stable suggests that medical Rx warranted Risk Assessment/Calculations:              Physical Exam:   VS:  BP 116/70 (BP Location: Left Arm, Patient Position: Sitting, Cuff Size: Normal)   Pulse 60   Ht 5' 5 (1.651 m)   Wt 157 lb (71.2 kg)   BMI 26.13 kg/m    Wt Readings from Last 3 Encounters:  07/13/24 157 lb (71.2 kg)  07/14/23 156 lb (70.8 kg)  07/12/22 167 lb 9.6 oz (76 kg)    GEN: Well nourished, well developed in no acute distress NECK: No JVD; No carotid bruits CARDIAC: RRR.  3/6 aortic murmur.  No rubs, gallops RESPIRATORY:  Clear to auscultation without rales, wheezing or rhonchi  ABDOMEN: Soft, non-tender, non-distended EXTREMITIES:  No edema; No acute deformity      Assessment and Plan:  Coronary artery disease Coronary CTA 05/2022 showed coronary calcium score of 2127 with disease in LAD and left circumflex.  FFR was positive in the distal LAD and small OM branch.  Per Dr. Anner, LAD lesion was a poor target for PCI and OM was too small for PCI.  Medical therapy was recommended at the time - EKG today shows no acute ischemic changes - Patient remains very active attending the gym 4 times a week, walking half a mile using elliptical to  machine and rowing without exertional chest pains or dyspnea on exertion.  There is no indication for further ischemic evaluation or further titration of antianginal therapy at this time - Continue aspirin 81 mg daily.  Does take 325 mg some days due to back pain - Continue amlodipine 2.5 mg daily - Continue rosuvastatin 5 mg daily  Hypertension Blood pressure today is well-controlled at 116/70 Experiences lightheadedness when going from squatting to standing but not sitting to standing.  He is not orthostatic.  No syncope, presyncope, or falls - Encouraged lower extremity stockings and adequate hydration and transitioning positions slowly - Avoid AV nodal blocking agents given history of bradycardia - Continue amlodipine 2.5 mg  Hyperlipidemia LDL 70 on 04/2024 and currently at goal - Continue rosuvastatin 5 mg daily  Mild-moderate aortic stenosis Echocardiogram 06/2022 showed severe calcification of the aortic valve, severe thickening of the aortic valve, mild aortic valve regurgitation and mild to moderate aortic valve stenosis with mean gradient 13 mmHg -  He remains without exertional symptoms.  No chest pain, DOE, syncope or near syncope.  He remains euvolemic and well compensated - Plan for repeat echocardiogram today to reevaluate aortic stenosis  Type 2 diabetes A1c 6.7% on 04/2024 and well-controlled - Managed on Farxiga 10 mg daily, glimepiride  2 mg daily, and metformin  1000 mg twice daily - Management per PCP      Dispo:  Return in about 1 year (around 07/13/2025).  Signed, [name] LITTIE Louis, NP

## 2024-08-01 ENCOUNTER — Other Ambulatory Visit (HOSPITAL_BASED_OUTPATIENT_CLINIC_OR_DEPARTMENT_OTHER): Payer: Self-pay | Admitting: Internal Medicine

## 2024-08-01 DIAGNOSIS — R413 Other amnesia: Secondary | ICD-10-CM

## 2024-08-01 DIAGNOSIS — Z23 Encounter for immunization: Secondary | ICD-10-CM | POA: Diagnosis not present

## 2024-08-03 ENCOUNTER — Ambulatory Visit (HOSPITAL_BASED_OUTPATIENT_CLINIC_OR_DEPARTMENT_OTHER)

## 2024-08-07 ENCOUNTER — Ambulatory Visit (HOSPITAL_BASED_OUTPATIENT_CLINIC_OR_DEPARTMENT_OTHER)
Admission: RE | Admit: 2024-08-07 | Discharge: 2024-08-07 | Disposition: A | Discharge status: Home / Self Care | Source: Ambulatory Visit | Attending: Internal Medicine | Admitting: Internal Medicine

## 2024-08-07 DIAGNOSIS — R413 Other amnesia: Secondary | ICD-10-CM | POA: Diagnosis not present

## 2024-08-07 DIAGNOSIS — I6782 Cerebral ischemia: Secondary | ICD-10-CM | POA: Diagnosis not present

## 2024-08-15 ENCOUNTER — Ambulatory Visit (INDEPENDENT_AMBULATORY_CARE_PROVIDER_SITE_OTHER)

## 2024-08-15 ENCOUNTER — Ambulatory Visit (HOSPITAL_COMMUNITY)
Admission: EM | Admit: 2024-08-15 | Discharge: 2024-08-15 | Disposition: A | Discharge status: Home / Self Care | Source: Home / Self Care | Attending: Family Medicine | Admitting: Family Medicine

## 2024-08-15 ENCOUNTER — Other Ambulatory Visit: Payer: Self-pay

## 2024-08-15 ENCOUNTER — Encounter (HOSPITAL_COMMUNITY): Payer: Self-pay | Admitting: Emergency Medicine

## 2024-08-15 DIAGNOSIS — Z833 Family history of diabetes mellitus: Secondary | ICD-10-CM | POA: Diagnosis not present

## 2024-08-15 DIAGNOSIS — R110 Nausea: Secondary | ICD-10-CM

## 2024-08-15 DIAGNOSIS — N200 Calculus of kidney: Secondary | ICD-10-CM | POA: Diagnosis not present

## 2024-08-15 DIAGNOSIS — Z823 Family history of stroke: Secondary | ICD-10-CM | POA: Diagnosis not present

## 2024-08-15 DIAGNOSIS — G4733 Obstructive sleep apnea (adult) (pediatric): Secondary | ICD-10-CM | POA: Diagnosis present

## 2024-08-15 DIAGNOSIS — Z91040 Latex allergy status: Secondary | ICD-10-CM | POA: Diagnosis not present

## 2024-08-15 DIAGNOSIS — N23 Unspecified renal colic: Secondary | ICD-10-CM | POA: Diagnosis present

## 2024-08-15 DIAGNOSIS — I2510 Atherosclerotic heart disease of native coronary artery without angina pectoris: Secondary | ICD-10-CM | POA: Diagnosis present

## 2024-08-15 DIAGNOSIS — M549 Dorsalgia, unspecified: Secondary | ICD-10-CM | POA: Diagnosis not present

## 2024-08-15 DIAGNOSIS — E872 Acidosis, unspecified: Secondary | ICD-10-CM | POA: Diagnosis present

## 2024-08-15 DIAGNOSIS — R10A0 Flank pain, unspecified side: Secondary | ICD-10-CM | POA: Diagnosis not present

## 2024-08-15 DIAGNOSIS — Z87891 Personal history of nicotine dependence: Secondary | ICD-10-CM | POA: Diagnosis not present

## 2024-08-15 DIAGNOSIS — D72829 Elevated white blood cell count, unspecified: Secondary | ICD-10-CM | POA: Diagnosis not present

## 2024-08-15 DIAGNOSIS — Z79899 Other long term (current) drug therapy: Secondary | ICD-10-CM | POA: Diagnosis not present

## 2024-08-15 DIAGNOSIS — Z886 Allergy status to analgesic agent status: Secondary | ICD-10-CM | POA: Diagnosis not present

## 2024-08-15 DIAGNOSIS — N400 Enlarged prostate without lower urinary tract symptoms: Secondary | ICD-10-CM | POA: Diagnosis not present

## 2024-08-15 DIAGNOSIS — Z91199 Patient's noncompliance with other medical treatment and regimen due to unspecified reason: Secondary | ICD-10-CM | POA: Diagnosis not present

## 2024-08-15 DIAGNOSIS — Z96653 Presence of artificial knee joint, bilateral: Secondary | ICD-10-CM | POA: Diagnosis present

## 2024-08-15 DIAGNOSIS — Z8616 Personal history of COVID-19: Secondary | ICD-10-CM | POA: Diagnosis not present

## 2024-08-15 DIAGNOSIS — N179 Acute kidney failure, unspecified: Secondary | ICD-10-CM | POA: Diagnosis present

## 2024-08-15 DIAGNOSIS — D696 Thrombocytopenia, unspecified: Secondary | ICD-10-CM | POA: Diagnosis present

## 2024-08-15 DIAGNOSIS — E119 Type 2 diabetes mellitus without complications: Secondary | ICD-10-CM | POA: Diagnosis present

## 2024-08-15 DIAGNOSIS — N201 Calculus of ureter: Secondary | ICD-10-CM | POA: Diagnosis not present

## 2024-08-15 DIAGNOSIS — I10 Essential (primary) hypertension: Secondary | ICD-10-CM | POA: Diagnosis present

## 2024-08-15 DIAGNOSIS — Z888 Allergy status to other drugs, medicaments and biological substances status: Secondary | ICD-10-CM | POA: Diagnosis not present

## 2024-08-15 DIAGNOSIS — N132 Hydronephrosis with renal and ureteral calculous obstruction: Secondary | ICD-10-CM | POA: Diagnosis present

## 2024-08-15 DIAGNOSIS — E785 Hyperlipidemia, unspecified: Secondary | ICD-10-CM | POA: Diagnosis present

## 2024-08-15 DIAGNOSIS — Z7984 Long term (current) use of oral hypoglycemic drugs: Secondary | ICD-10-CM | POA: Diagnosis not present

## 2024-08-15 DIAGNOSIS — K5730 Diverticulosis of large intestine without perforation or abscess without bleeding: Secondary | ICD-10-CM | POA: Diagnosis not present

## 2024-08-15 DIAGNOSIS — R10A2 Flank pain, left side: Secondary | ICD-10-CM | POA: Diagnosis not present

## 2024-08-15 LAB — POCT URINALYSIS DIP (MANUAL ENTRY)
Bilirubin, UA: NEGATIVE
Glucose, UA: 500 mg/dL — AB
Leukocytes, UA: NEGATIVE
Nitrite, UA: NEGATIVE
Protein Ur, POC: NEGATIVE mg/dL
Spec Grav, UA: 1.02 (ref 1.010–1.025)
Urobilinogen, UA: 0.2 U/dL
pH, UA: 5 (ref 5.0–8.0)

## 2024-08-15 LAB — CBC WITH DIFFERENTIAL/PLATELET
Abs Immature Granulocytes: 0.06 K/uL (ref 0.00–0.07)
Basophils Absolute: 0 K/uL (ref 0.0–0.1)
Basophils Relative: 0 %
Eosinophils Absolute: 0 K/uL (ref 0.0–0.5)
Eosinophils Relative: 0 %
HCT: 45.1 % (ref 39.0–52.0)
Hemoglobin: 14.6 g/dL (ref 13.0–17.0)
Immature Granulocytes: 0 %
Lymphocytes Relative: 5 %
Lymphs Abs: 0.7 K/uL (ref 0.7–4.0)
MCH: 30.7 pg (ref 26.0–34.0)
MCHC: 32.4 g/dL (ref 30.0–36.0)
MCV: 94.9 fL (ref 80.0–100.0)
Monocytes Absolute: 0.4 K/uL (ref 0.1–1.0)
Monocytes Relative: 3 %
Neutro Abs: 12.2 K/uL — ABNORMAL HIGH (ref 1.7–7.7)
Neutrophils Relative %: 92 %
Platelets: 187 K/uL (ref 150–400)
RBC: 4.75 MIL/uL (ref 4.22–5.81)
RDW: 12.9 % (ref 11.5–15.5)
WBC: 13.5 K/uL — ABNORMAL HIGH (ref 4.0–10.5)
nRBC: 0 % (ref 0.0–0.2)

## 2024-08-15 LAB — BASIC METABOLIC PANEL WITH GFR
Anion gap: 17 — ABNORMAL HIGH (ref 5–15)
BUN: 26 mg/dL — ABNORMAL HIGH (ref 8–23)
CO2: 24 mmol/L (ref 22–32)
Calcium: 9.9 mg/dL (ref 8.9–10.3)
Chloride: 99 mmol/L (ref 98–111)
Creatinine, Ser: 1.63 mg/dL — ABNORMAL HIGH (ref 0.61–1.24)
GFR, Estimated: 42 mL/min — ABNORMAL LOW (ref >60)
Glucose, Bld: 192 mg/dL — ABNORMAL HIGH (ref 70–99)
Potassium: 5 mmol/L (ref 3.5–5.1)
Sodium: 140 mmol/L (ref 135–145)

## 2024-08-15 MED ORDER — HYDROCODONE-ACETAMINOPHEN 5-325 MG PO TABS: 1.0000 | ORAL_TABLET | Freq: Four times a day (QID) | ORAL | 0 refills | Status: AC | PRN

## 2024-08-15 MED ORDER — ONDANSETRON 4 MG PO TBDP
4.0000 mg | ORAL_TABLET | Freq: Once | ORAL | Status: AC
  Administered 2024-08-15: 4 mg via ORAL

## 2024-08-15 MED ORDER — ONDANSETRON 4 MG PO TBDP
ORAL_TABLET | ORAL | Status: AC
  Filled 2024-08-15: qty 1

## 2024-08-15 MED ORDER — KETOROLAC TROMETHAMINE 30 MG/ML IJ SOLN
30.0000 mg | Freq: Once | INTRAMUSCULAR | Status: AC
  Administered 2024-08-15: 30 mg via INTRAMUSCULAR

## 2024-08-15 MED ORDER — ONDANSETRON HCL 4 MG PO TABS: 4.0000 mg | ORAL_TABLET | Freq: Four times a day (QID) | ORAL | 0 refills | Status: AC

## 2024-08-15 MED ORDER — TAMSULOSIN HCL 0.4 MG PO CAPS: 0.4000 mg | ORAL_CAPSULE | Freq: Every day | ORAL | 0 refills | Status: AC

## 2024-08-15 MED ORDER — KETOROLAC TROMETHAMINE 30 MG/ML IJ SOLN
INTRAMUSCULAR | Status: AC
  Filled 2024-08-15: qty 1

## 2024-08-15 NOTE — ED Triage Notes (Signed)
 Pain woke patient this morning.  Pain in left back and flank.  Feels nauseated, has not vomited.  No issue urinating.  States he feels off.  Has not felt this way before.  Has taken 2 tylenol .  Patient woke at 4:50 am today.  Last BM was approximately one hour ago.  Initially stool was hard , then a bit watery

## 2024-08-15 NOTE — Discharge Instructions (Addendum)
 You were seen today for flank pain, which is likely a kidney stone.  I have sent out medications to help with pain, nausea and to help pass the stone.  Please get plenty of fluids.  If your pain is not well controlled, or you have worsening nausea, vomiting, fevers or chills, then please go to the Emergency Room for further evaluation.  Your blood work will be resulted later today.  If there is anything concerning you will be notified for further instruction.

## 2024-08-15 NOTE — ED Provider Notes (Addendum)
 [facility]    CSN: [id number] Arrival date & time: 08/15/24  9094      History   Chief Complaint Chief Complaint  Patient presents with   Back Pain    HPI [name] is a 83 y.o. male.    Back Pain  Patient is here for pain to the left flank, that wraps to the lower front of the abdomen.  This started this morning.  Pain is 10/10 at this time.  It is worsening as the day goes.  No fevers/chills.  He was a bit clammy this morning.   He is having nausea, no vomiting.  No urinary symptoms noted.  This morning he had a hard stool, but then a bit watery.  He did take tylenol  without much help.  No h/o kidney stones.   He states he has normal kidney function.  He gets upset stomach with certain NSAIDS      Past Medical History:  Diagnosis Date   Allergic rhinitis    Arthritis THUMB   Coronary artery disease, non-occlusive 05/19/2022   Coronary Calcium Score 2127.  CAD RADS 4 concerning for obstructive disease in the LAD and LCx. => FFR ct positive for distal LAD and small OM branch.  Medical management warranted.   COVID-19 10/20/2021   Took Paxlovid   Diabetes mellitus, type 2 (HCC)    HAS RAPID DROPS IN BLOOD SUGAR; on metformin  and glimepiride    Diverticulitis large intestine    History of Bell's palsy    Left-sided-resolved   Hyperlipidemia    -Switch to rosuvastatin 5 mg   Hypertension    On losartan  100 mg daily as well as amlodipine 2.5 mg daily.   Mild aortic stenosis by prior echocardiography 06/09/2022   Severe aortic valve calcification with mild to moderate aortic stenosis.  Mean gradient 13 mmHg. ->  Coronary Calcium Score showed AoV calcium score of 1428 c/w moderate AS   Nuclear sclerotic cataract of both eyes 03/26/2020   OSA on CPAP    10 cm H2O pressure; not using CPAP since May 2018   Psoriasis    Involving the scalp; followed by Dr. Ivin   Ruptured Bakers cyst 2013   Right leg   Seborrheic dermatitis     Patient Active  Problem List   Diagnosis Date Noted   Coronary artery disease involving native coronary artery of native heart 07/12/2022   Metabolic syndrome 05/01/2022   Essential hypertension 05/01/2022   Mild aortic stenosis by prior echocardiogram 05/01/2022   Hyperlipidemia with target LDL less than 70 04/30/2022   Precordial pain 04/30/2022   Pseudophakia 04/13/2021   OSA (obstructive sleep apnea) 04/13/2021   Diabetes mellitus without complication (HCC) 03/26/2020   Early stage nonexudative age-related macular degeneration of both eyes 03/26/2020   Osteoarthritis of left knee 07/31/2014   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   Acute blood loss anemia 08/02/2013   Primary osteoarthritis of right knee 08/29/2012   Meniscus, medial, bucket handle tear, old 08/29/2012    Past Surgical History:  Procedure Laterality Date   COLONOSCOPY WITH PROPOFOL  N/A 05/19/2015   Procedure: COLONOSCOPY WITH PROPOFOL ;  Surgeon: Gladis MARLA Louder, MD;  Location: [facility];  Service: Endoscopy;  Laterality: N/A;   HAND SURGERY Bilateral    Bilateral thumbs-tendon repair   HERNIA REPAIR     Hiatal hernia   KNEE ARTHROSCOPY  1988 (APPROX)   RIGHT KNEE   TONSILLECTOMY     TOTAL KNEE ARTHROPLASTY Right 08/01/2013  Procedure: RIGHT TOTAL KNEE ARTHROPLASTY;  Surgeon: Tanda DELENA Heading, MD;  Location: [facility];  Service: Orthopedics;  Laterality: Right;   TOTAL KNEE ARTHROPLASTY Left 07/31/2014   Procedure: TOTAL LEFT KNEE ARTHROPLASTY;  Surgeon: Tanda DELENA Heading, MD;  Location: [facility];  Service: Orthopedics;  Laterality: Left;   TRANSTHORACIC ECHOCARDIOGRAM  11/2015   (Murmur) EF 60 to 65%.  Normal LV systolic function with GR 1 DD (normal for age.  Mild AS.  Mean gradient 10 mmHg.=> Follow-up echo 2022 read as stable stable EF and stable AS   VASECTOMY         Home Medications    Prior to Admission medications   Medication Sig Start Date End Date Taking? Authorizing Provider  acetaminophen  (TYLENOL )  650 MG CR tablet Take 1,300 mg by mouth daily.    [provider]  amLODipine (NORVASC) 2.5 MG tablet Take 2.5 mg by mouth daily. 03/16/22   [provider]  cholecalciferol (VITAMIN D3) 25 MCG (1000 UNIT) tablet 1 tablet Orally Once a day; Duration: 30 day(s)    [provider]  dapagliflozin propanediol (FARXIGA) 10 MG TABS tablet Take 10 mg by mouth daily. 08/12/22   [provider]  glimepiride  (AMARYL ) 2 MG tablet Take 1 mg by mouth every morning.    [provider]  losartan  (COZAAR ) 100 MG tablet Take 100 mg by mouth every morning.     [provider]  metFORMIN  (GLUCOPHAGE ) 1000 MG tablet Take 1,000 mg by mouth 2 (two) times daily with a meal.    [provider]  nitroGLYCERIN  (NITROSTAT ) 0.4 MG SL tablet DISSOLVE 1 TABLET UNDER THE TONGUE EVERY 5 MINUTES FOR UP TO 3 DOSES AS NEEDED FOR CHEST PAIN. IF NO RELIEF AFTER 3 DOSES, CALL 911 OR GO TO ER. 10/06/23   Vicci Rollo SAUNDERS, PA-C  rosuvastatin (CRESTOR) 5 MG tablet Take 5 mg by mouth daily. 03/16/22   [provider]  tamsulosin (FLOMAX) 0.4 MG CAPS capsule Take 0.4 mg by mouth at bedtime. 03/16/22   [provider]    Family History Family History  Problem Relation Age of Onset   Diabetes Mother    Stroke Father 37   Diabetes Sister    Diabetes Brother     Social History Social History   Tobacco Use   Smoking status: Former    Current packs/day: 0.00    Average packs/day: 1 pack/day for 10.0 years (10.0 ttl pk-yrs)    Types: Cigarettes    Start date: 07/26/1969    Quit date: 07/27/1979    Years since quitting: 45.0   Smokeless tobacco: Never  Vaping Use   Vaping status: Never Used  Substance Use Topics   Alcohol use: No   Drug use: No     Allergies   Ibuprofen, Latex, Lotensin [benazepril], Meloxicam, and Simvastatin    Review of Systems Review of Systems  Constitutional: Negative.   HENT: Negative.    Respiratory: Negative.     Cardiovascular: Negative.   Gastrointestinal: Negative.   Genitourinary: Negative.   Musculoskeletal:  Positive for back pain.  Psychiatric/Behavioral: Negative.       Physical Exam Triage Vital Signs ED Triage Vitals  Encounter Vitals Group     BP 08/15/24 0945 126/62     Girls Systolic BP Percentile --      Girls Diastolic BP Percentile --      Boys Systolic BP Percentile --      Boys Diastolic BP Percentile --  Pulse Rate 08/15/24 0945 (!) 50     Resp 08/15/24 0945 (!) 24     Temp 08/15/24 0945 (!) 97.3 F (36.3 C)     Temp Source 08/15/24 0945 Oral     SpO2 08/15/24 0945 98 %     Weight --      Height --      Head Circumference --      Peak Flow --      Pain Score 08/15/24 0941 10     Pain Loc --      Pain Education --      Exclude from Growth Chart --    No data found.  Updated Vital Signs BP 126/62 (BP Location: Left Arm)   Pulse (!) 50   Temp (!) 97.3 F (36.3 C) (Oral)   Resp (!) 24   SpO2 98%   Visual Acuity Right Eye Distance:   Left Eye Distance:   Bilateral Distance:    Right Eye Near:   Left Eye Near:    Bilateral Near:     Physical Exam Constitutional:      General: He is not in acute distress.    Appearance: Normal appearance. He is normal weight.     Comments: Appears uncomfortable  Cardiovascular:     Rate and Rhythm: Normal rate and regular rhythm.  Pulmonary:     Effort: Pulmonary effort is normal.     Breath sounds: Normal breath sounds.  Abdominal:     Palpations: Abdomen is soft.     Tenderness: There is no abdominal tenderness. There is left CVA tenderness. There is no guarding or rebound.  Musculoskeletal:     Cervical back: Normal range of motion and neck supple.  Skin:    General: Skin is warm.  Neurological:     General: No focal deficit present.     Mental Status: He is alert.  Psychiatric:        Mood and Affect: Mood normal.      UC Treatments / Results  Labs (all labs ordered are listed, but only  abnormal results are displayed) Labs Reviewed  POCT URINALYSIS DIP (MANUAL ENTRY) - Abnormal; Notable for the following components:      Result Value   Glucose, UA =500 (*)    Ketones, POC UA trace (5) (*)    Blood, UA large (*)    All other components within normal limits  CBC WITH DIFFERENTIAL/PLATELET  BASIC METABOLIC PANEL WITH GFR    EKG   Radiology No results found.  Procedures Procedures (including critical care time)  Medications Ordered in UC Medications  ketorolac  (TORADOL ) 30 MG/ML injection 30 mg (has no administration in time range)  ondansetron  (ZOFRAN -ODT) disintegrating tablet 4 mg (4 mg Oral Given 08/15/24 0954)    Initial Impression / Assessment and Plan / UC Course  I have reviewed the triage vital signs and the nursing notes.  Pertinent labs & imaging results that were available during my care of the patient were reviewed by me and considered in my medical decision making (see chart for details).    Final Clinical Impressions(s) / UC Diagnoses   Final diagnoses:  Flank pain, unspecified laterality  Nausea without vomiting  Kidney stone on left side     Discharge Instructions      You were seen today for flank pain, which is likely a kidney stone.  I have sent out medications to help with pain, nausea and to help pass the stone.  Please  get plenty of fluids.  If your pain is not well controlled, or you have worsening nausea, vomiting, fevers or chills, then please go to the Emergency Room for further evaluation.  Your blood work will be resulted later today.  If there is anything concerning you will be notified for further instruction.     ED Prescriptions     Medication Sig Dispense Auth. Provider   ondansetron  (ZOFRAN ) 4 MG tablet Take 1 tablet (4 mg total) by mouth every 6 (six) hours. 12 tablet Priscilla Kirstein, MD   tamsulosin (FLOMAX) 0.4 MG CAPS capsule Take 1 capsule (0.4 mg total) by mouth daily for 10 days. 10 capsule Savanna Dooley, MD    HYDROcodone -acetaminophen  (NORCO/VICODIN) 5-325 MG tablet Take 1 tablet by mouth every 6 (six) hours as needed for up to 12 doses. 12 tablet Darral Longs, MD      PDMP not reviewed this encounter.   Darral Longs, MD 08/15/24 1106    Darral Longs, MD 08/15/24 [phone]

## 2024-08-16 ENCOUNTER — Other Ambulatory Visit: Payer: Self-pay

## 2024-08-16 ENCOUNTER — Encounter (HOSPITAL_COMMUNITY): Payer: Self-pay

## 2024-08-16 ENCOUNTER — Emergency Department (HOSPITAL_COMMUNITY)

## 2024-08-16 ENCOUNTER — Inpatient Hospital Stay (HOSPITAL_COMMUNITY)
Admission: EM | Admit: 2024-08-16 | Discharge: 2024-08-18 | DRG: 694 | Disposition: A | Attending: Nurse Practitioner | Admitting: Nurse Practitioner

## 2024-08-16 DIAGNOSIS — Z833 Family history of diabetes mellitus: Secondary | ICD-10-CM

## 2024-08-16 DIAGNOSIS — Z96653 Presence of artificial knee joint, bilateral: Secondary | ICD-10-CM | POA: Diagnosis present

## 2024-08-16 DIAGNOSIS — I2510 Atherosclerotic heart disease of native coronary artery without angina pectoris: Secondary | ICD-10-CM | POA: Diagnosis present

## 2024-08-16 DIAGNOSIS — Z91040 Latex allergy status: Secondary | ICD-10-CM

## 2024-08-16 DIAGNOSIS — G4733 Obstructive sleep apnea (adult) (pediatric): Secondary | ICD-10-CM | POA: Diagnosis present

## 2024-08-16 DIAGNOSIS — E785 Hyperlipidemia, unspecified: Secondary | ICD-10-CM | POA: Diagnosis present

## 2024-08-16 DIAGNOSIS — N132 Hydronephrosis with renal and ureteral calculous obstruction: Principal | ICD-10-CM | POA: Diagnosis present

## 2024-08-16 DIAGNOSIS — Z91199 Patient's noncompliance with other medical treatment and regimen due to unspecified reason: Secondary | ICD-10-CM

## 2024-08-16 DIAGNOSIS — Z8616 Personal history of COVID-19: Secondary | ICD-10-CM

## 2024-08-16 DIAGNOSIS — E119 Type 2 diabetes mellitus without complications: Secondary | ICD-10-CM | POA: Diagnosis present

## 2024-08-16 DIAGNOSIS — D72829 Elevated white blood cell count, unspecified: Secondary | ICD-10-CM | POA: Diagnosis present

## 2024-08-16 DIAGNOSIS — Z823 Family history of stroke: Secondary | ICD-10-CM

## 2024-08-16 DIAGNOSIS — N179 Acute kidney failure, unspecified: Secondary | ICD-10-CM | POA: Diagnosis present

## 2024-08-16 DIAGNOSIS — Z7984 Long term (current) use of oral hypoglycemic drugs: Secondary | ICD-10-CM

## 2024-08-16 DIAGNOSIS — Z79899 Other long term (current) drug therapy: Secondary | ICD-10-CM

## 2024-08-16 DIAGNOSIS — I10 Essential (primary) hypertension: Secondary | ICD-10-CM | POA: Diagnosis present

## 2024-08-16 DIAGNOSIS — N201 Calculus of ureter: Secondary | ICD-10-CM | POA: Diagnosis present

## 2024-08-16 DIAGNOSIS — N23 Unspecified renal colic: Principal | ICD-10-CM

## 2024-08-16 DIAGNOSIS — Z888 Allergy status to other drugs, medicaments and biological substances status: Secondary | ICD-10-CM

## 2024-08-16 DIAGNOSIS — Z886 Allergy status to analgesic agent status: Secondary | ICD-10-CM

## 2024-08-16 DIAGNOSIS — E872 Acidosis, unspecified: Secondary | ICD-10-CM | POA: Diagnosis present

## 2024-08-16 DIAGNOSIS — D696 Thrombocytopenia, unspecified: Secondary | ICD-10-CM | POA: Diagnosis present

## 2024-08-16 DIAGNOSIS — Z87891 Personal history of nicotine dependence: Secondary | ICD-10-CM

## 2024-08-16 LAB — CBC
HCT: 41.1 % (ref 39.0–52.0)
Hemoglobin: 13.6 g/dL (ref 13.0–17.0)
MCH: 31.3 pg (ref 26.0–34.0)
MCHC: 33.1 g/dL (ref 30.0–36.0)
MCV: 94.7 fL (ref 80.0–100.0)
Platelets: 174 K/uL (ref 150–400)
RBC: 4.34 MIL/uL (ref 4.22–5.81)
RDW: 13 % (ref 11.5–15.5)
WBC: 11.1 K/uL — ABNORMAL HIGH (ref 4.0–10.5)
nRBC: 0 % (ref 0.0–0.2)

## 2024-08-16 LAB — COMPREHENSIVE METABOLIC PANEL WITH GFR
ALT: 19 U/L (ref 0–44)
AST: 21 U/L (ref 15–41)
Albumin: 3.7 g/dL (ref 3.5–5.0)
Alkaline Phosphatase: 37 U/L — ABNORMAL LOW (ref 38–126)
Anion gap: 11 (ref 5–15)
BUN: 42 mg/dL — ABNORMAL HIGH (ref 8–23)
CO2: 23 mmol/L (ref 22–32)
Calcium: 9 mg/dL (ref 8.9–10.3)
Chloride: 101 mmol/L (ref 98–111)
Creatinine, Ser: 2.58 mg/dL — ABNORMAL HIGH (ref 0.61–1.24)
GFR, Estimated: 24 mL/min — ABNORMAL LOW (ref >60)
Glucose, Bld: 209 mg/dL — ABNORMAL HIGH (ref 70–99)
Potassium: 4.8 mmol/L (ref 3.5–5.1)
Sodium: 135 mmol/L (ref 135–145)
Total Bilirubin: 0.6 mg/dL (ref 0.0–1.2)
Total Protein: 6.2 g/dL — ABNORMAL LOW (ref 6.5–8.1)

## 2024-08-16 LAB — URINALYSIS, ROUTINE W REFLEX MICROSCOPIC
Bilirubin Urine: NEGATIVE
Glucose, UA: >=500 mg/dL — AB
Ketones, ur: NEGATIVE mg/dL
Leukocytes,Ua: NEGATIVE
Nitrite: NEGATIVE
Protein, ur: NEGATIVE mg/dL
Specific Gravity, Urine: 1.022 (ref 1.005–1.030)
pH: 5 (ref 5.0–8.0)

## 2024-08-16 LAB — LIPASE, BLOOD: Lipase: 105 U/L — ABNORMAL HIGH (ref 11–51)

## 2024-08-16 MED ORDER — LACTATED RINGERS IV BOLUS
1000.0000 mL | Freq: Once | INTRAVENOUS | Status: AC
  Administered 2024-08-16: 1000 mL via INTRAVENOUS

## 2024-08-16 MED ORDER — HYDROMORPHONE HCL 1 MG/ML IJ SOLN
1.0000 mg | Freq: Once | INTRAMUSCULAR | Status: AC
  Administered 2024-08-16: 1 mg via INTRAVENOUS
  Filled 2024-08-16: qty 1

## 2024-08-16 MED ORDER — ONDANSETRON HCL 4 MG/2ML IJ SOLN
4.0000 mg | Freq: Once | INTRAMUSCULAR | Status: AC
  Administered 2024-08-16: 4 mg via INTRAVENOUS
  Filled 2024-08-16: qty 2

## 2024-08-16 NOTE — ED Provider Notes (Signed)
 [place] EMERGENCY DEPARTMENT AT [facility] Provider Note   CSN: [id number] Arrival date & time: 08/16/24  1619     Patient presents with: Flank Pain   [name] is a 83 y.o. male.    Flank Pain   Patient presents ED for evaluation of left-sided flank pain.  Patient started having symptoms yesterday.  It was radiating towards the front of his abdomen.  Pain has been waxing and waning but at times it is very severe 10 out of 10.  He has not had any fevers or chills.  He went to an urgent care yesterday who noted the patient had hematuria.  They felt his symptoms were consistent with a kidney stone.  He he was given a prescription for pain medications and instructed to come to the ED if the symptoms worsen.  Patient had increasing pain today so he came into the ED for evaluation  Prior to Admission medications   Medication Sig Start Date End Date Taking? Authorizing Provider  acetaminophen  (TYLENOL ) 650 MG CR tablet Take 1,300 mg by mouth daily.    [provider]  amLODipine (NORVASC) 2.5 MG tablet Take 2.5 mg by mouth daily. 03/16/22   [provider]  cholecalciferol (VITAMIN D3) 25 MCG (1000 UNIT) tablet 1 tablet Orally Once a day; Duration: 30 day(s)    [provider]  dapagliflozin propanediol (FARXIGA) 10 MG TABS tablet Take 10 mg by mouth daily. 08/12/22   [provider]  glimepiride  (AMARYL ) 2 MG tablet Take 1 mg by mouth every morning.    [provider]  HYDROcodone -acetaminophen  (NORCO/VICODIN) 5-325 MG tablet Take 1 tablet by mouth every 6 (six) hours as needed for up to 12 doses. 08/15/24   Piontek, Rocky, MD  losartan  (COZAAR ) 100 MG tablet Take 100 mg by mouth every morning.     [provider]  metFORMIN  (GLUCOPHAGE ) 1000 MG tablet Take 1,000 mg by mouth 2 (two) times daily with a meal.    [provider]  nitroGLYCERIN  (NITROSTAT ) 0.4 MG SL tablet DISSOLVE 1 TABLET UNDER THE TONGUE EVERY 5  MINUTES FOR UP TO 3 DOSES AS NEEDED FOR CHEST PAIN. IF NO RELIEF AFTER 3 DOSES, CALL 911 OR GO TO ER. 10/06/23   Vicci Rollo SAUNDERS, PA-C  ondansetron  (ZOFRAN ) 4 MG tablet Take 1 tablet (4 mg total) by mouth every 6 (six) hours. 08/15/24   Piontek, Rocky, MD  rosuvastatin (CRESTOR) 5 MG tablet Take 5 mg by mouth daily. 03/16/22   [provider]  tamsulosin (FLOMAX) 0.4 MG CAPS capsule Take 1 capsule (0.4 mg total) by mouth daily for 10 days. 08/15/24 08/25/24  Darral Rocky, MD    Allergies: Ibuprofen, Latex, Lotensin [benazepril], Meloxicam, and Simvastatin     Review of Systems  Genitourinary:  Positive for flank pain.    Updated Vital Signs BP 132/73   Pulse 68   Temp 98.7 F (37.1 C) (Oral)   Resp 18   Ht 1.651 m (5' 5)   Wt 70.3 kg   SpO2 96%   BMI 25.79 kg/m   Physical Exam Vitals and nursing note reviewed.  Constitutional:      General: He is not in acute distress.    Appearance: He is well-developed.  HENT:     Head: Normocephalic and atraumatic.     Right Ear: External ear normal.     Left Ear: External ear normal.  Eyes:     General: No scleral icterus.  Right eye: No discharge.        Left eye: No discharge.     Conjunctiva/sclera: Conjunctivae normal.  Neck:     Trachea: No tracheal deviation.  Cardiovascular:     Rate and Rhythm: Normal rate and regular rhythm.  Pulmonary:     Effort: Pulmonary effort is normal. No respiratory distress.     Breath sounds: Normal breath sounds. No stridor. No wheezing or rales.  Abdominal:     General: Bowel sounds are normal. There is no distension.     Palpations: Abdomen is soft.     Tenderness: There is no abdominal tenderness. There is no guarding or rebound.  Musculoskeletal:        General: No tenderness or deformity.     Cervical back: Neck supple.  Skin:    General: Skin is warm and dry.     Findings: No rash.  Neurological:     General: No focal deficit present.     Mental Status: He is  alert.     Cranial Nerves: No cranial nerve deficit, dysarthria or facial asymmetry.     Sensory: No sensory deficit.     Motor: No abnormal muscle tone or seizure activity.     Coordination: Coordination normal.  Psychiatric:        Mood and Affect: Mood normal.     (all labs ordered are listed, but only abnormal results are displayed) Labs Reviewed  LIPASE, BLOOD - Abnormal; Notable for the following components:      Result Value   Lipase 105 (*)    All other components within normal limits  COMPREHENSIVE METABOLIC PANEL WITH GFR - Abnormal; Notable for the following components:   Glucose, Bld 209 (*)    BUN 42 (*)    Creatinine, Ser 2.58 (*)    Total Protein 6.2 (*)    Alkaline Phosphatase 37 (*)    GFR, Estimated 24 (*)    All other components within normal limits  CBC - Abnormal; Notable for the following components:   WBC 11.1 (*)    All other components within normal limits  URINALYSIS, ROUTINE W REFLEX MICROSCOPIC - Abnormal; Notable for the following components:   Glucose, UA >=500 (*)    Hgb urine dipstick MODERATE (*)    Bacteria, UA RARE (*)    All other components within normal limits  BASIC METABOLIC PANEL WITH GFR    EKG: None  Radiology: CT Renal Stone Study Result Date: 08/16/2024 CLINICAL DATA:  Left flank pain, back pain EXAM: CT ABDOMEN AND PELVIS WITHOUT CONTRAST TECHNIQUE: Multidetector CT imaging of the abdomen and pelvis was performed following the standard protocol without IV contrast. RADIATION DOSE REDUCTION: This exam was performed according to the departmental dose-optimization program which includes automated exposure control, adjustment of the mA and/or kV according to patient size and/or use of iterative reconstruction technique. COMPARISON:  None Available. FINDINGS: Lower chest: No acute findings Hepatobiliary: No focal hepatic abnormality. Gallbladder unremarkable. Pancreas: No focal abnormality or ductal dilatation. Spleen: No focal  abnormality.  Normal size. Adrenals/Urinary Tract: Adrenal glands normal. Extensive left perinephric stranding and mild hydronephrosis. 2 mm stone in the mid left ureter. No stones or hydronephrosis on the right. 2.7 cm low-density area in the lower pole of the right kidney. 2 cm low-density lesion in the midpole of the right kidney. These are difficult to characterize on this noncontrast study. Urinary bladder decompressed, unremarkable. Stomach/Bowel: Diffuse colonic diverticulosis. No active diverticulitis. Stomach and small bowel decompressed.  Vascular/Lymphatic: Aortic atherosclerosis. No evidence of aneurysm or adenopathy. Reproductive: Prostate enlargement Other: No free fluid or free air. Musculoskeletal: No acute bony abnormality. 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 IMPRESSION: 2 mm mid left ureteral stone with mild left hydronephrosis and perinephric stranding. Colonic diverticulosis. Aortic atherosclerosis. Electronically Signed   By: Franky Crease M.D.   On: 08/16/2024 18:55   DG Abd 1 View Result Date: 08/15/2024 EXAM: 1 VIEW XRAY OF THE ABDOMEN 08/15/2024 10:52:49 AM COMPARISON: None available. CLINICAL HISTORY: Left flank pain; evaluate for stone. Pain woke patient this morning. Pain in left back and flank. Feels nauseated, has not vomited. No issue urinating. States he feels off. Has not felt this way before. Last BM was approximately one hour ago. Initially stool was hard, then a bit watery. FINDINGS: BOWEL: Nonobstructive bowel gas pattern. SOFT TISSUES: No opaque urinary calculi. BONES: No acute osseous abnormality. IMPRESSION: 1. No bowel obstruction. Electronically signed by: Lynwood Seip MD 08/15/2024 11:11 AM EDT RP Workstation: [id number]     Procedures   Medications Ordered in the ED  HYDROmorphone  (DILAUDID ) injection 1 mg (1 mg Intravenous Given 08/16/24 1927)  ondansetron  (ZOFRAN ) injection 4 mg (4 mg Intravenous Given 08/16/24 1926)  lactated  ringers  bolus 1,000 mL (1,000 mLs Intravenous New Bag/Given 08/16/24 1951)  HYDROmorphone  (DILAUDID ) injection 1 mg (1 mg Intravenous Given 08/16/24 2238)    Clinical Course as of 08/16/24 2356  Thu Aug 16, 2024  1907 Lipase, blood(!) Lipase increased to 105.  Metabolic panel shows increased BUN and creatinine [JK]  1907 Urinalysis, Routine w reflex microscopic -Urine, Clean Catch(!) Urinalysis does show moderate hemoglobin. [JK]  1907 CBC(!) White blood cell count slightly increased at 11.1 [JK]  1909 CT scan does show [JK]  1929 Discussed with Dr Elodia urology.  Recommends liter of iv fluid and recheck creatinine [JK]    Clinical Course User Index [JK] Randol Simmonds, MD                                 Medical Decision Making Problems Addressed: Ureteral colic: acute illness or injury that poses a threat to life or bodily functions  Amount and/or Complexity of Data Reviewed Labs: ordered. Decision-making details documented in ED Course. Discussion of management or test interpretation with external provider(s): Case discussed with urology Dr. Elodia  Risk Prescription drug management. Parenteral controlled substances.   Patient CT scan does confirm a 2 mm ureteral stone.  His urinalysis does show hematuria but no signs of infection.  Creatinine however is elevated compared to previous.  Consulted with urology.  Will plan to treat patient with at least a liter of fluid.  Will plan on rechecking creatinine after that.  As long as creatinine is improving patient can follow-up closely as outpatient  Case turned over to Dr. Wanita at shift change     Final diagnoses:  Ureteral colic    ED Discharge Orders     None          Randol Simmonds, MD 08/16/24 2357

## 2024-08-16 NOTE — ED Triage Notes (Signed)
 Pt c.o left side flank and back pain since this morning. Pt sent here from UC to ruel out kidney stone. Pt c.o nausea.

## 2024-08-17 ENCOUNTER — Ambulatory Visit (HOSPITAL_COMMUNITY): Payer: Self-pay

## 2024-08-17 DIAGNOSIS — N23 Unspecified renal colic: Secondary | ICD-10-CM | POA: Diagnosis present

## 2024-08-17 DIAGNOSIS — G4733 Obstructive sleep apnea (adult) (pediatric): Secondary | ICD-10-CM | POA: Diagnosis present

## 2024-08-17 DIAGNOSIS — I10 Essential (primary) hypertension: Secondary | ICD-10-CM | POA: Diagnosis present

## 2024-08-17 DIAGNOSIS — Z91040 Latex allergy status: Secondary | ICD-10-CM | POA: Diagnosis not present

## 2024-08-17 DIAGNOSIS — Z823 Family history of stroke: Secondary | ICD-10-CM | POA: Diagnosis not present

## 2024-08-17 DIAGNOSIS — Z833 Family history of diabetes mellitus: Secondary | ICD-10-CM | POA: Diagnosis not present

## 2024-08-17 DIAGNOSIS — Z888 Allergy status to other drugs, medicaments and biological substances status: Secondary | ICD-10-CM | POA: Diagnosis not present

## 2024-08-17 DIAGNOSIS — D72829 Elevated white blood cell count, unspecified: Secondary | ICD-10-CM | POA: Diagnosis present

## 2024-08-17 DIAGNOSIS — Z7984 Long term (current) use of oral hypoglycemic drugs: Secondary | ICD-10-CM | POA: Diagnosis not present

## 2024-08-17 DIAGNOSIS — E785 Hyperlipidemia, unspecified: Secondary | ICD-10-CM | POA: Diagnosis present

## 2024-08-17 DIAGNOSIS — Z8616 Personal history of COVID-19: Secondary | ICD-10-CM | POA: Diagnosis not present

## 2024-08-17 DIAGNOSIS — N179 Acute kidney failure, unspecified: Secondary | ICD-10-CM | POA: Diagnosis present

## 2024-08-17 DIAGNOSIS — E119 Type 2 diabetes mellitus without complications: Secondary | ICD-10-CM | POA: Diagnosis present

## 2024-08-17 DIAGNOSIS — N201 Calculus of ureter: Secondary | ICD-10-CM | POA: Diagnosis not present

## 2024-08-17 DIAGNOSIS — E872 Acidosis, unspecified: Secondary | ICD-10-CM | POA: Diagnosis present

## 2024-08-17 DIAGNOSIS — I2510 Atherosclerotic heart disease of native coronary artery without angina pectoris: Secondary | ICD-10-CM | POA: Diagnosis present

## 2024-08-17 DIAGNOSIS — Z87891 Personal history of nicotine dependence: Secondary | ICD-10-CM | POA: Diagnosis not present

## 2024-08-17 DIAGNOSIS — N132 Hydronephrosis with renal and ureteral calculous obstruction: Secondary | ICD-10-CM | POA: Diagnosis present

## 2024-08-17 DIAGNOSIS — Z79899 Other long term (current) drug therapy: Secondary | ICD-10-CM | POA: Diagnosis not present

## 2024-08-17 DIAGNOSIS — Z91199 Patient's noncompliance with other medical treatment and regimen due to unspecified reason: Secondary | ICD-10-CM | POA: Diagnosis not present

## 2024-08-17 DIAGNOSIS — D696 Thrombocytopenia, unspecified: Secondary | ICD-10-CM | POA: Diagnosis present

## 2024-08-17 DIAGNOSIS — Z96653 Presence of artificial knee joint, bilateral: Secondary | ICD-10-CM | POA: Diagnosis present

## 2024-08-17 DIAGNOSIS — Z886 Allergy status to analgesic agent status: Secondary | ICD-10-CM | POA: Diagnosis not present

## 2024-08-17 LAB — CBC
HCT: 37.8 % — ABNORMAL LOW (ref 39.0–52.0)
Hemoglobin: 12.3 g/dL — ABNORMAL LOW (ref 13.0–17.0)
MCH: 30.9 pg (ref 26.0–34.0)
MCHC: 32.5 g/dL (ref 30.0–36.0)
MCV: 95 fL (ref 80.0–100.0)
Platelets: 136 K/uL — ABNORMAL LOW (ref 150–400)
RBC: 3.98 MIL/uL — ABNORMAL LOW (ref 4.22–5.81)
RDW: 13.1 % (ref 11.5–15.5)
WBC: 11.6 K/uL — ABNORMAL HIGH (ref 4.0–10.5)
nRBC: 0 % (ref 0.0–0.2)

## 2024-08-17 LAB — BASIC METABOLIC PANEL WITH GFR
Anion gap: 8 (ref 5–15)
Anion gap: 9 (ref 5–15)
BUN: 36 mg/dL — ABNORMAL HIGH (ref 8–23)
BUN: 33 mg/dL — ABNORMAL HIGH (ref 8–23)
CO2: 24 mmol/L (ref 22–32)
CO2: 21 mmol/L — ABNORMAL LOW (ref 22–32)
Calcium: 8.2 mg/dL — ABNORMAL LOW (ref 8.9–10.3)
Calcium: 7.8 mg/dL — ABNORMAL LOW (ref 8.9–10.3)
Chloride: 104 mmol/L (ref 98–111)
Chloride: 105 mmol/L (ref 98–111)
Creatinine, Ser: 2.23 mg/dL — ABNORMAL HIGH (ref 0.61–1.24)
Creatinine, Ser: 2.07 mg/dL — ABNORMAL HIGH (ref 0.61–1.24)
GFR, Estimated: 29 mL/min — ABNORMAL LOW (ref >60)
GFR, Estimated: 32 mL/min — ABNORMAL LOW (ref >60)
Glucose, Bld: 126 mg/dL — ABNORMAL HIGH (ref 70–99)
Glucose, Bld: 131 mg/dL — ABNORMAL HIGH (ref 70–99)
Potassium: 5.3 mmol/L — ABNORMAL HIGH (ref 3.5–5.1)
Potassium: 4.1 mmol/L (ref 3.5–5.1)
Sodium: 136 mmol/L (ref 135–145)
Sodium: 135 mmol/L (ref 135–145)

## 2024-08-17 MED ORDER — SODIUM CHLORIDE 0.9 % IV SOLN: INTRAVENOUS | Status: DC

## 2024-08-17 MED ORDER — ENOXAPARIN SODIUM 40 MG/0.4ML IJ SOSY: 40.0000 mg | PREFILLED_SYRINGE | INTRAMUSCULAR | Status: DC

## 2024-08-17 MED ORDER — SODIUM CHLORIDE 0.9% FLUSH: 3.0000 mL | Freq: Two times a day (BID) | INTRAVENOUS | Status: DC

## 2024-08-17 MED ORDER — ACETAMINOPHEN 325 MG PO TABS: 650.0000 mg | ORAL_TABLET | Freq: Four times a day (QID) | ORAL | Status: DC | PRN

## 2024-08-17 MED ORDER — AMLODIPINE BESYLATE 2.5 MG PO TABS
2.5000 mg | ORAL_TABLET | Freq: Every day | ORAL | Status: DC
  Administered 2024-08-17 – 2024-08-18 (×2): 2.5 mg via ORAL
  Filled 2024-08-17 (×2): qty 1

## 2024-08-17 MED ORDER — FENTANYL CITRATE (PF) 50 MCG/ML IJ SOSY
25.0000 ug | PREFILLED_SYRINGE | INTRAMUSCULAR | Status: DC | PRN
  Administered 2024-08-17: 25 ug via INTRAVENOUS
  Filled 2024-08-17: qty 1

## 2024-08-17 MED ORDER — PROCHLORPERAZINE EDISYLATE 10 MG/2ML IJ SOLN: 5.0000 mg | Freq: Four times a day (QID) | INTRAMUSCULAR | Status: DC | PRN

## 2024-08-17 MED ORDER — OXYCODONE HCL 5 MG PO TABS
5.0000 mg | ORAL_TABLET | ORAL | Status: DC | PRN
  Administered 2024-08-17: 5 mg via ORAL
  Filled 2024-08-17: qty 1

## 2024-08-17 MED ORDER — LACTATED RINGERS IV SOLN: INTRAVENOUS | Status: DC

## 2024-08-17 MED ORDER — SODIUM CHLORIDE 0.9 % IV SOLN
1.0000 g | INTRAVENOUS | Status: DC
  Administered 2024-08-17: 1 g via INTRAVENOUS
  Filled 2024-08-17: qty 10

## 2024-08-17 MED ORDER — SODIUM CHLORIDE 0.9 % IV SOLN
2.0000 g | Freq: Once | INTRAVENOUS | Status: AC
  Administered 2024-08-17: 2 g via INTRAVENOUS
  Filled 2024-08-17: qty 20

## 2024-08-17 MED ORDER — TAMSULOSIN HCL 0.4 MG PO CAPS
0.4000 mg | ORAL_CAPSULE | Freq: Every day | ORAL | Status: DC
  Administered 2024-08-17 – 2024-08-18 (×2): 0.4 mg via ORAL
  Filled 2024-08-17 (×2): qty 1

## 2024-08-17 MED ORDER — ROSUVASTATIN CALCIUM 5 MG PO TABS
5.0000 mg | ORAL_TABLET | Freq: Every day | ORAL | Status: DC
  Administered 2024-08-17 – 2024-08-18 (×2): 5 mg via ORAL
  Filled 2024-08-17 (×2): qty 1

## 2024-08-17 MED ORDER — MORPHINE SULFATE (PF) 2 MG/ML IV SOLN: 1.0000 mg | INTRAVENOUS | Status: DC | PRN

## 2024-08-17 MED ORDER — VITAMIN D 25 MCG (1000 UNIT) PO TABS
1000.0000 [IU] | ORAL_TABLET | Freq: Every day | ORAL | Status: DC
  Administered 2024-08-17 – 2024-08-18 (×2): 1000 [IU] via ORAL
  Filled 2024-08-17 (×2): qty 1

## 2024-08-17 MED ORDER — OXYBUTYNIN CHLORIDE 5 MG PO TABS
2.5000 mg | ORAL_TABLET | Freq: Three times a day (TID) | ORAL | Status: DC
  Administered 2024-08-17 – 2024-08-18 (×4): 2.5 mg via ORAL
  Filled 2024-08-17 (×4): qty 1

## 2024-08-17 MED ORDER — HYDROCODONE-ACETAMINOPHEN 5-325 MG PO TABS
1.0000 | ORAL_TABLET | ORAL | Status: DC | PRN
  Administered 2024-08-17: 2 via ORAL
  Filled 2024-08-17: qty 2

## 2024-08-17 MED ORDER — ENOXAPARIN SODIUM 30 MG/0.3ML IJ SOSY
30.0000 mg | PREFILLED_SYRINGE | INTRAMUSCULAR | Status: DC
  Administered 2024-08-17 – 2024-08-18 (×2): 30 mg via SUBCUTANEOUS
  Filled 2024-08-17 (×2): qty 0.3

## 2024-08-17 NOTE — H&P (Addendum)
 History and Physical    Patient: [name] FMW:[id number] DOB: 1941/01/05 DOA: 08/16/2024 DOS: the patient was seen and examined on 08/17/2024 PCP: Ransom Other, MD  Patient coming from: Home-lives with wife   Chief Complaint:  Chief Complaint  Patient presents with   Flank Pain   HPI: [name] is a 83 y.o. male with medical history significant of osteoarthritis, diabetes mellitus 2 with symptomatic hypoglycemia previously while on glipizide and metformin , macular degeneration, OSA prescribed CPAP but does not use, dyslipidemia and hypertension.  Also found to have nonocclusive CAD in 2023 via CAD RADS with medical management recommended.  Mr. Fielden has been experiencing left back and flank pain that began on 10/15.  He presented to the ED on the same date.  It was felt his symptoms were related to a kidney stone.  He was not having any gross hematuria and the decision was made to send him out on pain medicines for symptom management.  No signs of infection.  Renal function was normal.  He returned to the ED on 10/16 stating pain had worsened and he was now having nausea.  CT renal stone study revealed a 2 mm mid left ureteral stone with mild left hydronephrosis and perinephric stranding.  He also had developed acute kidney injury with creatinine now 2.58 with a BUN of 42 with baseline creatinine 1.05 with BUN 32.  There were concerns that he may require intervention such as a stent by the urology team so hospitalist service was asked to evaluate the patient for admission.  Since admission patient has been evaluated by the urology team.  Since arrival his creatinine has begun to trend downward and as of the a.m. of 10/17 creatinine was 2.07.  There were no signs of infection.  The urology team determined at this point patient did not require operative intervention such as ureteral stent.  Plans were for patient to follow-up in the outpatient setting.  Upon my evaluation of the patient he  continued to have significant colicky left flank pain and was tender over the left flank.  No reported fevers chills or nausea and vomiting prior to admission and none since arrival.   Review of Systems: As mentioned in the history of present illness. All other systems reviewed and are negative.   Past Medical History:  Diagnosis Date   Allergic rhinitis    Arthritis THUMB   Coronary artery disease, non-occlusive 05/19/2022   Coronary Calcium Score 2127.  CAD RADS 4 concerning for obstructive disease in the LAD and LCx. => FFR ct positive for distal LAD and small OM branch.  Medical management warranted.   COVID-19 10/20/2021   Took Paxlovid   Diabetes mellitus, type 2 (HCC)    HAS RAPID DROPS IN BLOOD SUGAR; on metformin  and glimepiride    Diverticulitis large intestine    History of Bell's palsy    Left-sided-resolved   Hyperlipidemia    -Switch to rosuvastatin 5 mg   Hypertension    On losartan  100 mg daily as well as amlodipine 2.5 mg daily.   Mild aortic stenosis by prior echocardiography 06/09/2022   Severe aortic valve calcification with mild to moderate aortic stenosis.  Mean gradient 13 mmHg. ->  Coronary Calcium Score showed AoV calcium score of 1428 c/w moderate AS   Nuclear sclerotic cataract of both eyes 03/26/2020   OSA on CPAP    10 cm H2O pressure; not using CPAP since May 2018   Psoriasis    Involving the scalp; followed  by Dr. Ivin   Ruptured Bakers cyst 2013   Right leg   Seborrheic dermatitis    Past Surgical History:  Procedure Laterality Date   COLONOSCOPY WITH PROPOFOL  N/A 05/19/2015   Procedure: COLONOSCOPY WITH PROPOFOL ;  Surgeon: Gladis MARLA Louder, MD;  Location: [facility];  Service: Endoscopy;  Laterality: N/A;   HAND SURGERY Bilateral    Bilateral thumbs-tendon repair   HERNIA REPAIR     Hiatal hernia   KNEE ARTHROSCOPY  1988 (APPROX)   RIGHT KNEE   TONSILLECTOMY     TOTAL KNEE ARTHROPLASTY Right 08/01/2013   Procedure: RIGHT TOTAL KNEE  ARTHROPLASTY;  Surgeon: Tanda DELENA Heading, MD;  Location: [facility];  Service: Orthopedics;  Laterality: Right;   TOTAL KNEE ARTHROPLASTY Left 07/31/2014   Procedure: TOTAL LEFT KNEE ARTHROPLASTY;  Surgeon: Tanda DELENA Heading, MD;  Location: [facility];  Service: Orthopedics;  Laterality: Left;   TRANSTHORACIC ECHOCARDIOGRAM  11/2015   (Murmur) EF 60 to 65%.  Normal LV systolic function with GR 1 DD (normal for age.  Mild AS.  Mean gradient 10 mmHg.=> Follow-up echo 2022 read as stable stable EF and stable AS   VASECTOMY     Social History:  reports that he quit smoking about 45 years ago. His smoking use included cigarettes. He started smoking about 55 years ago. He has a 10 pack-year smoking history. He has never used smokeless tobacco. He reports that he does not drink alcohol and does not use drugs.  Allergies  Allergen Reactions   Ibuprofen    Latex Dermatitis   Lotensin [Benazepril]    Meloxicam    Simvastatin      Family History  Problem Relation Age of Onset   Diabetes Mother    Stroke Father 46   Diabetes Sister    Diabetes Brother     Prior to Admission medications   Medication Sig Start Date End Date Taking? Authorizing Provider  acetaminophen  (TYLENOL ) 650 MG CR tablet Take 1,300 mg by mouth daily.    [provider]  amLODipine (NORVASC) 2.5 MG tablet Take 2.5 mg by mouth daily. 03/16/22   [provider]  cholecalciferol (VITAMIN D3) 25 MCG (1000 UNIT) tablet 1 tablet Orally Once a day; Duration: 30 day(s)    [provider]  dapagliflozin propanediol (FARXIGA) 10 MG TABS tablet Take 10 mg by mouth daily. 08/12/22   [provider]  glimepiride  (AMARYL ) 2 MG tablet Take 1 mg by mouth every morning.    [provider]  HYDROcodone -acetaminophen  (NORCO/VICODIN) 5-325 MG tablet Take 1 tablet by mouth every 6 (six) hours as needed for up to 12 doses. 08/15/24   Piontek, Rocky, MD  losartan  (COZAAR ) 100 MG tablet Take 100 mg by mouth every  morning.     [provider]  metFORMIN  (GLUCOPHAGE ) 1000 MG tablet Take 1,000 mg by mouth 2 (two) times daily with a meal.    [provider]  nitroGLYCERIN  (NITROSTAT ) 0.4 MG SL tablet DISSOLVE 1 TABLET UNDER THE TONGUE EVERY 5 MINUTES FOR UP TO 3 DOSES AS NEEDED FOR CHEST PAIN. IF NO RELIEF AFTER 3 DOSES, CALL 911 OR GO TO ER. 10/06/23   Louder Rollo SAUNDERS, PA-C  ondansetron  (ZOFRAN ) 4 MG tablet Take 1 tablet (4 mg total) by mouth every 6 (six) hours. 08/15/24   Piontek, Rocky, MD  rosuvastatin (CRESTOR) 5 MG tablet Take 5 mg by mouth daily. 03/16/22   [provider]  tamsulosin (FLOMAX) 0.4 MG CAPS capsule Take 1 capsule (  0.4 mg total) by mouth daily for 10 days. 08/15/24 08/25/24  Darral Longs, MD    Physical Exam: Vitals:   08/17/24 0245 08/17/24 0328 08/17/24 0605 08/17/24 0722  BP: 136/73  (!) 135/59 126/65  Pulse: 65  68 62  Resp:   16 16  Temp:  98.2 F (36.8 C) 98 F (36.7 C) 97.6 F (36.4 C)  TempSrc:  Oral Oral Oral  SpO2: 96%  98% 97%  Weight:      Height:       Constitutional: NAD, calm, uncomfortable Respiratory: clear to auscultation bilaterally, no wheezing, no crackles. Normal respiratory effort. No accessory muscle use.  Cardiovascular: Regular rate and rhythm, no rubs / gallops.  Mild grade 4/6 systolic murmur left sternal border second intercostal space.  No extremity edema. 2+ pedal pulses.  Abdomen: no tenderness, no masses palpated. No hepatosplenomegaly. Bowel sounds positive.  Genitourinary: CVAT positive left flank Musculoskeletal: no clubbing / cyanosis. No joint deformity upper and lower extremities. Good ROM, no contractures. Normal muscle tone.  Skin: no rashes, lesions, ulcers. No induration Neurologic: CN 2-12 grossly intact. Sensation intact, DTR normal. Strength 5/5 x all 4 extremities.  Psychiatric: Normal judgment and insight. Alert and oriented x 3. Normal mood.     Data Reviewed:  Sodium 135, potassium 4.1,  chloride 105, CO2 21, glucose 131, BUN 33, creatinine 2.07, GFR 32  WBC 11,600 differential not obtained, hemoglobin 12.3, platelets 136,000  Urinalysis with glycosuria, moderate hemoglobin, rare bacteria, WBC 0-5  Assessment and Plan: Left flank pain secondary to renal calculi Continues to have significant flank pain that is colicky in nature Notable for microscopic hematuria Since initiating diet will allow for Vicodin for moderate pain and IV morphine for breakthrough pain Initiate low-dose Ditropan 3 times daily for spasms Continue home Flomax Strain all urine Urine culture pending-CT did reveal perinephric stranding and patient has persistent leukocytosis and now thrombocytopenia that could be indicative of gram-negative infection.  Discussed with attending physician and will continue Rocephin/FU on urine cx  Mild obstructive uropathy with associated acute kidney injury Creatinine trending downward-baseline 1.05 in June 2025 Continue to follow labs Avoid nephrotoxic medications Continue lactated Ringer 's at 22 cc/h since still has mild metabolic acidemia  Incidental findings of low-density areas in the right kidney  Recommendation is to follow-up in the outpatient setting with a contrasted study to better characterize  Diabetes mellitus 2 Has had history of symptomatic hypoglycemia on oral agents in the past Given AKI will hold home Farxiga till follows up with PCP Follow CBGs and provide SSI Hold metformin  for at least 48 hours  Hypertension Current blood pressure well-controlled Given AKI will hold preadmission Cozaar  for at least 48 hours Continue Norvasc  Dyslipidemia Continue Crestor  OSA Does not utilize prescribed CPAP   Advance Care Planning:   Code Status: Full Code   VTE prophylaxis: Lovenox-continue to follow platelets since are trending downward  Consults: Urology  Family Communication: Patient only  Severity of Illness: The appropriate patient  status for this patient is INPATIENT. Inpatient status is judged to be reasonable and necessary in order to provide the required intensity of service to ensure the patient's safety. The patient's presenting symptoms, physical exam findings, and initial radiographic and laboratory data in the context of their chronic comorbidities is felt to place them at high risk for further clinical deterioration. Furthermore, it is not anticipated that the patient will be medically stable for discharge from the hospital within 2 midnights of admission.   *  I certify that at the point of admission it is my clinical judgment that the patient will require inpatient hospital care spanning beyond 2 midnights from the point of admission due to high intensity of service, high risk for further deterioration and high frequency of surveillance required.*  Author: Isaiah Lever, NP 08/17/2024 10:58 AM  For on call review [email or web address].

## 2024-08-17 NOTE — Hospital Course (Addendum)
 83 y.o. M with CAD, HTN, OSA noncompliant CPAP, DM, and HLD who presented with few days flank pain and nausea.  In the ER, found to have small stone with perinephric stranding and Cr 2.58 from baseline ~1.    Admitted and started on fluids and Rocephin.  Urology consulted, recommended against stent at this time.     BP 128/63 (BP Location: Right Arm)   Pulse 63   Temp 98.6 F (37 C) (Oral)   Resp 16   Ht 5' 5 (1.651 m)   Wt 70.3 kg   SpO2 98%   BMI 25.79 kg/m   Adult male, lying in bed, no acute distress RRR no murmurs, no LE edema Respiratory rate normal, lungs clear Abdomen soft, no signficant TTP with mild volluntary guarding Attention normal, affect normal, moves all extremities with normal coordination and strength, oriented x4    AKI Cr baseline 1.  Admitted with Cr 2.5, improving to 2 with fluids - IV fluids - Trend Cr - Avoid NSAIDs or Nephrotoxins - Hold ARB, metformin , Farxiga   Ureterolithiasis Hydronephrosis - Consult Urology - Analgesics   Possible UTI Stranding on CT and mild leukocytosis.   - Hold Farxiga - Continue Rocephin - Follow cultures

## 2024-08-17 NOTE — Consult Note (Signed)
 Urology Consult   Reason for consult: obstructing stone with AKI  History of Present Illness: [name] is a 83 y.o. with hx of DM and CAD who presented with left flank pain and found to have small obstructing mid/distal ureteral stone.   On evaluation, patient is AF, stable, and comfortable appearing. Labs notable for slightly elevated WBC; UA not concerning for infection. Cr was elevated at presentation to 2.6 (baseline ~1) but has been down trending 2.2>2.1 most recently.   He denies a history of voiding or storage urinary symptoms, hematuria, UTIs, STDs, urolithiasis, GU malignancy/trauma/surgery.  Past Medical History:  Diagnosis Date   Allergic rhinitis    Arthritis THUMB   Coronary artery disease, non-occlusive 05/19/2022   Coronary Calcium Score 2127.  CAD RADS 4 concerning for obstructive disease in the LAD and LCx. => FFR ct positive for distal LAD and small OM branch.  Medical management warranted.   COVID-19 10/20/2021   Took Paxlovid   Diabetes mellitus, type 2 (HCC)    HAS RAPID DROPS IN BLOOD SUGAR; on metformin  and glimepiride    Diverticulitis large intestine    History of Bell's palsy    Left-sided-resolved   Hyperlipidemia    -Switch to rosuvastatin 5 mg   Hypertension    On losartan  100 mg daily as well as amlodipine 2.5 mg daily.   Mild aortic stenosis by prior echocardiography 06/09/2022   Severe aortic valve calcification with mild to moderate aortic stenosis.  Mean gradient 13 mmHg. ->  Coronary Calcium Score showed AoV calcium score of 1428 c/w moderate AS   Nuclear sclerotic cataract of both eyes 03/26/2020   OSA on CPAP    10 cm H2O pressure; not using CPAP since May 2018   Psoriasis    Involving the scalp; followed by Dr. Ivin   Ruptured Bakers cyst 2013   Right leg   Seborrheic dermatitis     Past Surgical History:  Procedure Laterality Date   COLONOSCOPY WITH PROPOFOL  N/A 05/19/2015   Procedure: COLONOSCOPY WITH PROPOFOL ;  Surgeon: Gladis MARLA Louder, MD;  Location: [facility];  Service: Endoscopy;  Laterality: N/A;   HAND SURGERY Bilateral    Bilateral thumbs-tendon repair   HERNIA REPAIR     Hiatal hernia   KNEE ARTHROSCOPY  1988 (APPROX)   RIGHT KNEE   TONSILLECTOMY     TOTAL KNEE ARTHROPLASTY Right 08/01/2013   Procedure: RIGHT TOTAL KNEE ARTHROPLASTY;  Surgeon: Tanda DELENA Heading, MD;  Location: [facility];  Service: Orthopedics;  Laterality: Right;   TOTAL KNEE ARTHROPLASTY Left 07/31/2014   Procedure: TOTAL LEFT KNEE ARTHROPLASTY;  Surgeon: Tanda DELENA Heading, MD;  Location: [facility];  Service: Orthopedics;  Laterality: Left;   TRANSTHORACIC ECHOCARDIOGRAM  11/2015   (Murmur) EF 60 to 65%.  Normal LV systolic function with GR 1 DD (normal for age.  Mild AS.  Mean gradient 10 mmHg.=> Follow-up echo 2022 read as stable stable EF and stable AS   VASECTOMY      Current Hospital Medications:  Home Meds:  Current Facility-Administered Medications on File Prior to Encounter  Medication Dose Route Frequency Provider Last Rate Last Admin   dexamethasone  (DECADRON ) injection 10 mg  10 mg Intravenous Once Porterfield, Amber, PA-C       Current Outpatient Medications on File Prior to Encounter  Medication Sig Dispense Refill   acetaminophen  (TYLENOL ) 650 MG CR tablet Take 1,300 mg by mouth daily.     amLODipine (NORVASC) 2.5 MG tablet Take 2.5 mg by  mouth daily.     cholecalciferol (VITAMIN D3) 25 MCG (1000 UNIT) tablet 1 tablet Orally Once a day; Duration: 30 day(s)     dapagliflozin propanediol (FARXIGA) 10 MG TABS tablet Take 10 mg by mouth daily.     glimepiride  (AMARYL ) 2 MG tablet Take 1 mg by mouth every morning.     HYDROcodone -acetaminophen  (NORCO/VICODIN) 5-325 MG tablet Take 1 tablet by mouth every 6 (six) hours as needed for up to 12 doses. 12 tablet 0   losartan  (COZAAR ) 100 MG tablet Take 100 mg by mouth every morning.      metFORMIN  (GLUCOPHAGE ) 1000 MG tablet Take 1,000 mg by mouth 2 (two) times daily with a meal.      nitroGLYCERIN  (NITROSTAT ) 0.4 MG SL tablet DISSOLVE 1 TABLET UNDER THE TONGUE EVERY 5 MINUTES FOR UP TO 3 DOSES AS NEEDED FOR CHEST PAIN. IF NO RELIEF AFTER 3 DOSES, CALL 911 OR GO TO ER. 25 tablet 6   ondansetron  (ZOFRAN ) 4 MG tablet Take 1 tablet (4 mg total) by mouth every 6 (six) hours. 12 tablet 0   rosuvastatin (CRESTOR) 5 MG tablet Take 5 mg by mouth daily.     tamsulosin (FLOMAX) 0.4 MG CAPS capsule Take 1 capsule (0.4 mg total) by mouth daily for 10 days. 10 capsule 0     Scheduled Meds: Continuous Infusions:  sodium chloride  125 mL/hr at 08/17/24 0242   PRN Meds:.acetaminophen , fentaNYL  (SUBLIMAZE ) injection, oxyCODONE , prochlorperazine  Allergies:  Allergies  Allergen Reactions   Ibuprofen    Latex Dermatitis   Lotensin [Benazepril]    Meloxicam    Simvastatin      Family History  Problem Relation Age of Onset   Diabetes Mother    Stroke Father 53   Diabetes Sister    Diabetes Brother     Social History:  reports that he quit smoking about 45 years ago. His smoking use included cigarettes. He started smoking about 55 years ago. He has a 10 pack-year smoking history. He has never used smokeless tobacco. He reports that he does not drink alcohol and does not use drugs.  ROS: A complete review of systems was performed.  All systems are negative except for pertinent findings as noted.  Physical Exam:  Vital signs in last 24 hours: Temp:  [98.2 F (36.8 C)-98.7 F (37.1 C)] 98.2 F (36.8 C) (10/17 0328) Pulse Rate:  [65-70] 65 (10/17 0245) Resp:  [16-20] 20 (10/17 0120) BP: (99-137)/(58-73) 136/73 (10/17 0245) SpO2:  [95 %-99 %] 96 % (10/17 0245) Weight:  [70.3 kg] 70.3 kg (10/16 1636) Constitutional:  Alert and oriented, No acute distress Cardiovascular: Regular rate and rhythm, No JVD Respiratory: Normal respiratory effort, Lungs clear bilaterally GI: Abdomen is soft, nontender, nondistended. Previous hernia scar well healed.  Neurologic: Grossly intact,  no focal deficits Psychiatric: Normal mood and affect  Laboratory Data:  Recent Labs    08/15/24 1102 08/16/24 1639 08/17/24 0524  WBC 13.5* 11.1* 11.6*  HGB 14.6 13.6 12.3*  HCT 45.1 41.1 37.8*  PLT 187 174 136*    Recent Labs    08/15/24 1102 08/16/24 1639 08/16/24 2115 08/17/24 0524  NA 140 135 136 135  K 5.0 4.8 5.3* 4.1  CL 99 101 104 105  GLUCOSE 192* 209* 126* 131*  BUN 26* 42* 36* 33*  CALCIUM 9.9 9.0 8.2* 7.8*  CREATININE 1.63* 2.58* 2.23* 2.07*     Results for orders placed or performed during the hospital encounter of 08/16/24 (from the past  24 hours)  Lipase, blood     Status: Abnormal   Collection Time: 08/16/24  4:39 PM  Result Value Ref Range   Lipase 105 (H) 11 - 51 U/L  Comprehensive metabolic panel     Status: Abnormal   Collection Time: 08/16/24  4:39 PM  Result Value Ref Range   Sodium 135 135 - 145 mmol/L   Potassium 4.8 3.5 - 5.1 mmol/L   Chloride 101 98 - 111 mmol/L   CO2 23 22 - 32 mmol/L   Glucose, Bld 209 (H) 70 - 99 mg/dL   BUN 42 (H) 8 - 23 mg/dL   Creatinine, Ser 7.41 (H) 0.61 - 1.24 mg/dL   Calcium 9.0 8.9 - 89.6 mg/dL   Total Protein 6.2 (L) 6.5 - 8.1 g/dL   Albumin 3.7 3.5 - 5.0 g/dL   AST 21 15 - 41 U/L   ALT 19 0 - 44 U/L   Alkaline Phosphatase 37 (L) 38 - 126 U/L   Total Bilirubin 0.6 0.0 - 1.2 mg/dL   GFR, Estimated 24 (L) >60 mL/min   Anion gap 11 5 - 15  CBC     Status: Abnormal   Collection Time: 08/16/24  4:39 PM  Result Value Ref Range   WBC 11.1 (H) 4.0 - 10.5 K/uL   RBC 4.34 4.22 - 5.81 MIL/uL   Hemoglobin 13.6 13.0 - 17.0 g/dL   HCT 58.8 60.9 - 47.9 %   MCV 94.7 80.0 - 100.0 fL   MCH 31.3 26.0 - 34.0 pg   MCHC 33.1 30.0 - 36.0 g/dL   RDW 86.9 88.4 - 84.4 %   Platelets 174 150 - 400 K/uL   nRBC 0.0 0.0 - 0.2 %  Urinalysis, Routine w reflex microscopic -Urine, Clean Catch     Status: Abnormal   Collection Time: 08/16/24  4:39 PM  Result Value Ref Range   Color, Urine YELLOW YELLOW   APPearance CLEAR  CLEAR   Specific Gravity, Urine 1.022 1.005 - 1.030   pH 5.0 5.0 - 8.0   Glucose, UA >=500 (A) NEGATIVE mg/dL   Hgb urine dipstick MODERATE (A) NEGATIVE   Bilirubin Urine NEGATIVE NEGATIVE   Ketones, ur NEGATIVE NEGATIVE mg/dL   Protein, ur NEGATIVE NEGATIVE mg/dL   Nitrite NEGATIVE NEGATIVE   Leukocytes,Ua NEGATIVE NEGATIVE   RBC / HPF 6-10 0 - 5 RBC/hpf   WBC, UA 0-5 0 - 5 WBC/hpf   Bacteria, UA RARE (A) NONE SEEN   Squamous Epithelial / HPF 0-5 0 - 5 /HPF   Mucus PRESENT   Basic metabolic panel     Status: Abnormal   Collection Time: 08/16/24  9:15 PM  Result Value Ref Range   Sodium 136 135 - 145 mmol/L   Potassium 5.3 (H) 3.5 - 5.1 mmol/L   Chloride 104 98 - 111 mmol/L   CO2 24 22 - 32 mmol/L   Glucose, Bld 126 (H) 70 - 99 mg/dL   BUN 36 (H) 8 - 23 mg/dL   Creatinine, Ser 7.76 (H) 0.61 - 1.24 mg/dL   Calcium 8.2 (L) 8.9 - 10.3 mg/dL   GFR, Estimated 29 (L) >60 mL/min   Anion gap 8 5 - 15  CBC     Status: Abnormal   Collection Time: 08/17/24  5:24 AM  Result Value Ref Range   WBC 11.6 (H) 4.0 - 10.5 K/uL   RBC 3.98 (L) 4.22 - 5.81 MIL/uL   Hemoglobin 12.3 (L) 13.0 - 17.0 g/dL  HCT 37.8 (L) 39.0 - 52.0 %   MCV 95.0 80.0 - 100.0 fL   MCH 30.9 26.0 - 34.0 pg   MCHC 32.5 30.0 - 36.0 g/dL   RDW 86.8 88.4 - 84.4 %   Platelets 136 (L) 150 - 400 K/uL   nRBC 0.0 0.0 - 0.2 %  Basic metabolic panel     Status: Abnormal   Collection Time: 08/17/24  5:24 AM  Result Value Ref Range   Sodium 135 135 - 145 mmol/L   Potassium 4.1 3.5 - 5.1 mmol/L   Chloride 105 98 - 111 mmol/L   CO2 21 (L) 22 - 32 mmol/L   Glucose, Bld 131 (H) 70 - 99 mg/dL   BUN 33 (H) 8 - 23 mg/dL   Creatinine, Ser 7.92 (H) 0.61 - 1.24 mg/dL   Calcium 7.8 (L) 8.9 - 10.3 mg/dL   GFR, Estimated 32 (L) >60 mL/min   Anion gap 9 5 - 15   No results found for this or any previous visit (from the past 240 hours).  Renal Function: Recent Labs    08/15/24 1102 08/16/24 1639 08/16/24 2115 08/17/24 0524   CREATININE 1.63* 2.58* 2.23* 2.07*   Estimated Creatinine Clearance: 24.3 mL/min (A) (by C-G formula based on SCr of 2.07 mg/dL (H)).  Radiologic Imaging: CT Renal Stone Study Result Date: 08/16/2024 CLINICAL DATA:  Left flank pain, back pain EXAM: CT ABDOMEN AND PELVIS WITHOUT CONTRAST TECHNIQUE: Multidetector CT imaging of the abdomen and pelvis was performed following the standard protocol without IV contrast. RADIATION DOSE REDUCTION: This exam was performed according to the departmental dose-optimization program which includes automated exposure control, adjustment of the mA and/or kV according to patient size and/or use of iterative reconstruction technique. COMPARISON:  None Available. FINDINGS: Lower chest: No acute findings Hepatobiliary: No focal hepatic abnormality. Gallbladder unremarkable. Pancreas: No focal abnormality or ductal dilatation. Spleen: No focal abnormality.  Normal size. Adrenals/Urinary Tract: Adrenal glands normal. Extensive left perinephric stranding and mild hydronephrosis. 2 mm stone in the mid left ureter. No stones or hydronephrosis on the right. 2.7 cm low-density area in the lower pole of the right kidney. 2 cm low-density lesion in the midpole of the right kidney. These are difficult to characterize on this noncontrast study. Urinary bladder decompressed, unremarkable. Stomach/Bowel: Diffuse colonic diverticulosis. No active diverticulitis. Stomach and small bowel decompressed. Vascular/Lymphatic: Aortic atherosclerosis. No evidence of aneurysm or adenopathy. Reproductive: Prostate enlargement Other: No free fluid or free air. Musculoskeletal: No acute bony abnormality. 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 IMPRESSION: 2 mm mid left ureteral stone with mild left hydronephrosis and perinephric stranding. Colonic diverticulosis. Aortic atherosclerosis. Electronically Signed   By: Franky Crease M.D.   On: 08/16/2024 18:55   DG Abd 1  View Result Date: 08/15/2024 EXAM: 1 VIEW XRAY OF THE ABDOMEN 08/15/2024 10:52:49 AM COMPARISON: None available. CLINICAL HISTORY: Left flank pain; evaluate for stone. Pain woke patient this morning. Pain in left back and flank. Feels nauseated, has not vomited. No issue urinating. States he feels off. Has not felt this way before. Last BM was approximately one hour ago. Initially stool was hard, then a bit watery. FINDINGS: BOWEL: Nonobstructive bowel gas pattern. SOFT TISSUES: No opaque urinary calculi. BONES: No acute osseous abnormality. IMPRESSION: 1. No bowel obstruction. Electronically signed by: Lynwood Seip MD 08/15/2024 11:11 AM EDT RP Workstation: [id number]    I independently reviewed the above imaging studies.  Impression/Recommendation 41M with hx of DM  and CAD who presents with obstructing left kidney stone and AKI.   We discussed the indications for acute intervention including infected obstruction, bilateral ureteral obstruction or unilateral obstruction of solitary kidney as well as other less urgent indications for decompression which would included intractable pain, N/V, and acute renal injury. We also discussed the possible inability to place a ureteral stent in which case, the next intervention recommended would be a percutaneous nephrostomy tube. As his Cr is downtrending and there is no signs of infection, there is no indication for intervention at this point.   #Kidney stone -Continue conservative management with fluids and Flomax daily  -Okay for regular diet. Encourage PO intake of fluids  -Strain all urine -Monitor Cr   #Kidney masses on right side -Discussed with patient that these will need follow up with additional contrasted imaging to better characterize -Please have patient call [facility] to set up follow-up.    Lauriana Denes N Kristopher Delk 08/17/2024, 6:03 AM

## 2024-08-17 NOTE — ED Provider Notes (Signed)
 Patient signed out to me by Dr. Randol to follow-up on lab work.  Patient seen with flank pain and diagnosed with left ureteral stone.  Patient noted to have acute kidney injury.  A discussion with urology resulted in the plan to hydrate the patient and recheck his chemistry.  This has been performed and his creatinine has not significantly improved.  Discussed with Dr. Mitchell, on-call for urology.  Patient will be admitted, continue to hydrate overnight.  If renal function does not improve, will be stented in the morning.  Keep NPO.   Haze Lonni PARAS, MD 08/17/24 [phone]

## 2024-08-18 DIAGNOSIS — N201 Calculus of ureter: Secondary | ICD-10-CM | POA: Diagnosis not present

## 2024-08-18 LAB — CBC
HCT: 36.6 % — ABNORMAL LOW (ref 39.0–52.0)
Hemoglobin: 12.3 g/dL — ABNORMAL LOW (ref 13.0–17.0)
MCH: 31.4 pg (ref 26.0–34.0)
MCHC: 33.6 g/dL (ref 30.0–36.0)
MCV: 93.4 fL (ref 80.0–100.0)
Platelets: 122 K/uL — ABNORMAL LOW (ref 150–400)
RBC: 3.92 MIL/uL — ABNORMAL LOW (ref 4.22–5.81)
RDW: 13 % (ref 11.5–15.5)
WBC: 5.1 K/uL (ref 4.0–10.5)
nRBC: 0 % (ref 0.0–0.2)

## 2024-08-18 LAB — BASIC METABOLIC PANEL WITH GFR
Anion gap: 11 (ref 5–15)
BUN: 29 mg/dL — ABNORMAL HIGH (ref 8–23)
CO2: 23 mmol/L (ref 22–32)
Calcium: 8.6 mg/dL — ABNORMAL LOW (ref 8.9–10.3)
Chloride: 103 mmol/L (ref 98–111)
Creatinine, Ser: 1.62 mg/dL — ABNORMAL HIGH (ref 0.61–1.24)
GFR, Estimated: 42 mL/min — ABNORMAL LOW (ref >60)
Glucose, Bld: 93 mg/dL (ref 70–99)
Potassium: 4.1 mmol/L (ref 3.5–5.1)
Sodium: 137 mmol/L (ref 135–145)

## 2024-08-18 LAB — URINE CULTURE: Culture: NO GROWTH

## 2024-08-18 NOTE — Plan of Care (Signed)
  Problem: Activity: Goal: Risk for activity intolerance will decrease Outcome: Progressing   Problem: Elimination: Goal: Will not experience complications related to urinary retention Outcome: Progressing   Problem: Safety: Goal: Ability to remain free from injury will improve Outcome: Progressing   

## 2024-08-18 NOTE — Progress Notes (Signed)
 DISCHARGE NOTE HOME [name] to be discharged Home per MD order. Discussed prescriptions and follow up appointments with the patient. Prescriptions given to patient; medication list explained in detail. Patient verbalized understanding.  Skin clean, dry and intact without evidence of skin break down, no evidence of skin tears noted. IV catheter discontinued intact. Site without signs and symptoms of complications. Dressing and pressure applied. Pt denies pain at the site currently. No complaints noted.  Patient free of lines, drains, and wounds.   An After Visit Summary (AVS) was printed and given to the patient. Patient escorted via wheelchair, and discharged home via private auto.  [name], RN

## 2024-08-18 NOTE — Discharge Summary (Signed)
 Physician Discharge Summary   Patient: [name] MRN: [id number] DOB: June 26, 1941  Admit date:     08/16/2024  Discharge date: 08/18/24  Discharge Physician: Lonni SHAUNNA Dalton   PCP: Ransom Other, MD     Recommendations at discharge:  Follow up with [facility] for nephrolithiasis, clinically passed Follow up with PCP Dr. Other in 1 week for AKI Dr. Other: Please obtain CT with contrast or MRI abdomen to evaluate renal hypodensity on nonemergent basis     Discharge Diagnoses: Principal Problem:   Acute kidney injury Other hospital problems:   Ureteral calculus   Nephrolithiasis   Diabetes   Hypertension   Hyperlipidemia    Hospital Course: 83 y.o. M with CAD, HTN, OSA noncompliant CPAP, DM, and HLD who presented with few days flank pain and nausea.  In the ER, found to have small stone with perinephric stranding and Cr 2.58 from baseline ~1.    Admitted and started on fluids and Rocephin.  Urology consulted, recommended against stent.        AKI Cr baseline 1.  Admitted with Cr 2.5, improved to 1.6 with fluids.  ARB, metformin , Farxiga held at discharge.     Ureterolithiasis Hydronephrosis Patient passed some small dark debris while in the hospital and pain resolved.  Urology recommended outpatient followup.      UTI ruled out Treated with empiric antibiotics due to leukocytosis. Stranding on noncontrasted CT nonspecific.  Urine culture obtained before antibiotics had no growth, so Abx stopped.            The [place]  Controlled Substances Registry was reviewed for this patient prior to discharge.   Consultants: Urology Procedures performed: CT abdomen  Disposition: Home Diet recommendation:  Discharge Diet Orders (From admission, onward)     Start     Ordered   08/18/24 0000  Diet - low sodium heart healthy        08/18/24 0937             DISCHARGE MEDICATION: Allergies as of 08/18/2024       Reactions    Latex Dermatitis   Lotensin [benazepril] Cough   Mobic [meloxicam] Other (See Comments)   Kidney function   Motrin [ibuprofen] Other (See Comments)   Kidney function   Nsaids Other (See Comments)   Kidney function   Zocor  [simvastatin ] Other (See Comments)   Unknown reaction        Medication List     PAUSE taking these medications    dapagliflozin propanediol 10 MG Tabs tablet Wait to take this until your doctor or other care provider tells you to start again. Commonly known as: FARXIGA Take 10 mg by mouth daily.   losartan  100 MG tablet Wait to take this until your doctor or other care provider tells you to start again. Commonly known as: COZAAR  Take 100 mg by mouth daily.   metFORMIN  1000 MG tablet Wait to take this until your doctor or other care provider tells you to start again. Commonly known as: GLUCOPHAGE  Take 1,000 mg by mouth 2 (two) times daily.       TAKE these medications    acetaminophen  500 MG tablet Commonly known as: TYLENOL  Take 1,000 mg by mouth 2 (two) times daily as needed for fever or headache (pain).   Amaryl  2 MG tablet Generic drug: glimepiride  Take 0.5 mg by mouth daily as needed (elevated blood sugar).   amLODipine 2.5 MG tablet Commonly known as: NORVASC Take 2.5 mg by mouth  at bedtime.   Aspirin-Caffeine 845-65 MG Pack Take 1 packet by mouth daily as needed (back pain).   CALCIUM + D3 PO Take 1 tablet by mouth at bedtime.   HYDROcodone -acetaminophen  5-325 MG tablet Commonly known as: NORCO/VICODIN Take 1 tablet by mouth every 6 (six) hours as needed for up to 12 doses.   MAGNESIUM PO Take 1 tablet by mouth at bedtime.   Multivitamin Men 50+ Tabs Take 1 tablet by mouth at bedtime.   nitroGLYCERIN  0.4 MG SL tablet Commonly known as: NITROSTAT  DISSOLVE 1 TABLET UNDER THE TONGUE EVERY 5 MINUTES FOR UP TO 3 DOSES AS NEEDED FOR CHEST PAIN. IF NO RELIEF AFTER 3 DOSES, CALL 911 OR GO TO ER.   ondansetron  4 MG  tablet Commonly known as: ZOFRAN  Take 1 tablet (4 mg total) by mouth every 6 (six) hours.   pregabalin 25 MG capsule Commonly known as: LYRICA Take 25 mg by mouth at bedtime.   rosuvastatin 5 MG tablet Commonly known as: CRESTOR Take 5 mg by mouth daily.   tamsulosin 0.4 MG Caps capsule Commonly known as: FLOMAX Take 1 capsule (0.4 mg total) by mouth daily for 10 days. What changed: when to take this   VITAMIN B-12 PO Take 1 tablet by mouth at bedtime.   VITAMIN D-3 PO Take 1 capsule by mouth at bedtime.        Follow-up Information     Ransom Other, MD. Schedule an appointment as soon as possible for a visit in 1 week(s).   Specialty: Internal Medicine Contact information: [address] [place] [address] [phone]         [facility]. Schedule an appointment as soon as possible for a visit in 1 week(s).   Why: Let them know you were seen in the hospital by Dr. Mitchell and told to follow up with [facility] for kidney stone Contact information: [address] [place]  [address] [phone]                Discharge Instructions     Diet - low sodium heart healthy   Complete by: As directed    Discharge instructions   Complete by: As directed    **IMPORTANT DISCHARGE INSTRUCTIONS**   From Dr. Jonel: You were admitted for pain and had a CT scan that showed this was from a kidney stone  The kidney stone had also caused a beginning kidney failure, and so you were treated for this  For the next week: Do NOT take losartan , Farxiga or metformin   Drink LOTS of fluids  Go see your primary doctor in 1 week and get labs checked Ask him if you should resume those medicines  Also, you should call [facility] for a follow up appoinmtnet (see below in the To Do Section)  For pain, you may take acetaminophen /Tylenol   AVOID anything that is an NSAID pain reliever   Increase  activity slowly   Complete by: As directed        Discharge Exam: Filed Weights   08/16/24 1636  Weight: 70.3 kg    General: Pt is alert, awake, not in acute distress Cardiovascular: RRR, nl S1-S2, no murmurs appreciated.   No LE edema.   Respiratory: Normal respiratory rate and rhythm.  CTAB without rales or wheezes. Abdominal: Abdomen soft and non-tender.  No distension or HSM.   Neuro/Psych: Strength symmetric in upper and lower extremities.  Judgment and insight appear normal.  Condition at discharge: good  The results of significant diagnostics from this hospitalization (including imaging, microbiology, ancillary and laboratory) are listed below for reference.   Imaging Studies: CT Renal Stone Study Result Date: 08/16/2024 CLINICAL DATA:  Left flank pain, back pain EXAM: CT ABDOMEN AND PELVIS WITHOUT CONTRAST TECHNIQUE: Multidetector CT imaging of the abdomen and pelvis was performed following the standard protocol without IV contrast. RADIATION DOSE REDUCTION: This exam was performed according to the departmental dose-optimization program which includes automated exposure control, adjustment of the mA and/or kV according to patient size and/or use of iterative reconstruction technique. COMPARISON:  None Available. FINDINGS: Lower chest: No acute findings Hepatobiliary: No focal hepatic abnormality. Gallbladder unremarkable. Pancreas: No focal abnormality or ductal dilatation. Spleen: No focal abnormality.  Normal size. Adrenals/Urinary Tract: Adrenal glands normal. Extensive left perinephric stranding and mild hydronephrosis. 2 mm stone in the mid left ureter. No stones or hydronephrosis on the right. 2.7 cm low-density area in the lower pole of the right kidney. 2 cm low-density lesion in the midpole of the right kidney. These are difficult to characterize on this noncontrast study. Urinary bladder decompressed, unremarkable. Stomach/Bowel: Diffuse colonic diverticulosis. No active  diverticulitis. Stomach and small bowel decompressed. Vascular/Lymphatic: Aortic atherosclerosis. No evidence of aneurysm or adenopathy. Reproductive: Prostate enlargement Other: No free fluid or free air. Musculoskeletal: No acute bony abnormality. 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 IMPRESSION: 2 mm mid left ureteral stone with mild left hydronephrosis and perinephric stranding. Colonic diverticulosis. Aortic atherosclerosis. Electronically Signed   By: Franky Crease M.D.   On: 08/16/2024 18:55   DG Abd 1 View Result Date: 08/15/2024 EXAM: 1 VIEW XRAY OF THE ABDOMEN 08/15/2024 10:52:49 AM COMPARISON: None available. CLINICAL HISTORY: Left flank pain; evaluate for stone. Pain woke patient this morning. Pain in left back and flank. Feels nauseated, has not vomited. No issue urinating. States he feels off. Has not felt this way before. Last BM was approximately one hour ago. Initially stool was hard, then a bit watery. FINDINGS: BOWEL: Nonobstructive bowel gas pattern. SOFT TISSUES: No opaque urinary calculi. BONES: No acute osseous abnormality. IMPRESSION: 1. No bowel obstruction. Electronically signed by: Lynwood Seip MD 08/15/2024 11:11 AM EDT RP Workstation: [id number]   CT HEAD WO CONTRAST ( ) Result Date: 08/07/2024 EXAM: CT HEAD WITHOUT CONTRAST 08/07/2024 07:21:00 AM TECHNIQUE: CT of the head was performed without the administration of intravenous contrast. Automated exposure control, iterative reconstruction, and/or weight based adjustment of the mA/kV was utilized to reduce the radiation dose to as low as reasonably achievable. COMPARISON: None available. CLINICAL HISTORY: Pt noticed problems remembering things for over a year. FINDINGS: BRAIN AND VENTRICLES: Prominence of the sulci and ventricles compatible with brain atrophy. Mild patchy low attenuation within the subcortical and periventricular white matter compatible with chronic small vessel ischemic change. No  acute hemorrhage. No evidence of acute infarct. No hydrocephalus. No extra-axial collection. No mass effect or midline shift. ORBITS: No acute abnormality. SINUSES: No acute abnormality. SOFT TISSUES AND SKULL: No acute soft tissue abnormality. No skull fracture. IMPRESSION: 1. No acute intracranial abnormality. 2. 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 Electronically signed by: Waddell Calk MD 08/07/2024 07:30 AM EDT RP Workstation: [id number]    Microbiology: Results for orders placed or performed during the hospital encounter of 08/16/24  Urine Culture     Status: None   Collection Time: 08/17/24  2:03 AM   Specimen: Urine, Clean Catch  Result Value Ref Range  Status   Specimen Description URINE, CLEAN CATCH  Final   Special Requests NONE  Final   Culture   Final    NO GROWTH Performed at [facility], [address] [place], [place] [address]    Report Status 08/18/2024 FINAL  Final    Labs: CBC: Recent Labs  Lab 08/15/24 1102 08/16/24 1639 08/17/24 0524 08/18/24 0622  WBC 13.5* 11.1* 11.6* 5.1  NEUTROABS 12.2*  --   --   --   HGB 14.6 13.6 12.3* 12.3*  HCT 45.1 41.1 37.8* 36.6*  MCV 94.9 94.7 95.0 93.4  PLT 187 174 136* 122*   Basic Metabolic Panel: Recent Labs  Lab 08/15/24 1102 08/16/24 1639 08/16/24 2115 08/17/24 0524 08/18/24 0622  NA 140 135 136 135 137  K 5.0 4.8 5.3* 4.1 4.1  CL 99 101 104 105 103  CO2 24 23 24  21* 23  GLUCOSE 192* 209* 126* 131* 93  BUN 26* 42* 36* 33* 29*  CREATININE 1.63* 2.58* 2.23* 2.07* 1.62*  CALCIUM 9.9 9.0 8.2* 7.8* 8.6*   Liver Function Tests: Recent Labs  Lab 08/16/24 1639  AST 21  ALT 19  ALKPHOS 37*  BILITOT 0.6  PROT 6.2*  ALBUMIN 3.7   CBG: No results for input(s): GLUCAP in the last 168 hours.  Discharge time spent: approximately 45 minutes spent on discharge counseling, evaluation of patient on day of discharge, and coordination of discharge planning with nursing, social work,  pharmacy and case management  Signed: Lonni SHAUNNA Dalton, MD [facility] 08/18/2024

## 2024-08-21 ENCOUNTER — Ambulatory Visit (HOSPITAL_COMMUNITY)
Admission: RE | Admit: 2024-08-21 | Discharge: 2024-08-21 | Disposition: A | Discharge status: Home / Self Care | Source: Ambulatory Visit | Attending: Cardiology | Admitting: Cardiology

## 2024-08-21 DIAGNOSIS — I350 Nonrheumatic aortic (valve) stenosis: Secondary | ICD-10-CM | POA: Insufficient documentation

## 2024-08-21 LAB — ECHOCARDIOGRAM COMPLETE
AR max vel: 0.84 cm2
AV Area VTI: 0.81 cm2
AV Area mean vel: 0.83 cm2
AV Mean grad: 23 mmHg
AV Peak grad: 42.5 mmHg
Ao pk vel: 3.26 m/s
Area-P 1/2: 2.64 cm2
S' Lateral: 2.9 cm

## 2024-08-22 DIAGNOSIS — N201 Calculus of ureter: Secondary | ICD-10-CM | POA: Diagnosis not present

## 2024-08-22 DIAGNOSIS — R399 Unspecified symptoms and signs involving the genitourinary system: Secondary | ICD-10-CM | POA: Diagnosis not present

## 2024-08-23 DIAGNOSIS — N281 Cyst of kidney, acquired: Secondary | ICD-10-CM | POA: Diagnosis not present

## 2024-08-23 DIAGNOSIS — N179 Acute kidney failure, unspecified: Secondary | ICD-10-CM | POA: Diagnosis not present

## 2024-08-23 DIAGNOSIS — I10 Essential (primary) hypertension: Secondary | ICD-10-CM | POA: Diagnosis not present

## 2024-08-23 DIAGNOSIS — N200 Calculus of kidney: Secondary | ICD-10-CM | POA: Diagnosis not present

## 2024-08-28 ENCOUNTER — Ambulatory Visit: Payer: Self-pay | Admitting: Cardiology

## 2024-08-28 DIAGNOSIS — I350 Nonrheumatic aortic (valve) stenosis: Secondary | ICD-10-CM

## 2024-08-31 NOTE — Telephone Encounter (Signed)
 Patient is returning call.

## 2024-09-21 DIAGNOSIS — N281 Cyst of kidney, acquired: Secondary | ICD-10-CM | POA: Diagnosis not present

## 2024-09-21 DIAGNOSIS — N201 Calculus of ureter: Secondary | ICD-10-CM | POA: Diagnosis not present

## 2024-10-10 DIAGNOSIS — I10 Essential (primary) hypertension: Secondary | ICD-10-CM | POA: Diagnosis not present

## 2024-10-10 DIAGNOSIS — Z87442 Personal history of urinary calculi: Secondary | ICD-10-CM | POA: Diagnosis not present

## 2024-10-10 DIAGNOSIS — E1169 Type 2 diabetes mellitus with other specified complication: Secondary | ICD-10-CM | POA: Diagnosis not present

## 2024-10-10 DIAGNOSIS — E782 Mixed hyperlipidemia: Secondary | ICD-10-CM | POA: Diagnosis not present

## 2024-10-10 DIAGNOSIS — I2510 Atherosclerotic heart disease of native coronary artery without angina pectoris: Secondary | ICD-10-CM | POA: Diagnosis not present

## 2024-10-10 DIAGNOSIS — R059 Cough, unspecified: Secondary | ICD-10-CM | POA: Diagnosis not present

## 2024-10-10 DIAGNOSIS — I350 Nonrheumatic aortic (valve) stenosis: Secondary | ICD-10-CM | POA: Diagnosis not present

## 2025-01-21 ENCOUNTER — Ambulatory Visit (HOSPITAL_COMMUNITY)

## 2025-01-29 ENCOUNTER — Ambulatory Visit: Admitting: Emergency Medicine
